# Patient Record
Sex: Male | Born: 1965 | Race: Black or African American | Hispanic: No | Marital: Single | Smoking: Former smoker
Health system: Southern US, Community
[De-identification: ages and names within clinical notes are randomized; demographics above are authoritative.]

## PROBLEM LIST (undated history)

## (undated) DIAGNOSIS — E785 Hyperlipidemia, unspecified: Secondary | ICD-10-CM

## (undated) DIAGNOSIS — N471 Phimosis: Secondary | ICD-10-CM

## (undated) DIAGNOSIS — Z87448 Personal history of other diseases of urinary system: Secondary | ICD-10-CM

## (undated) DIAGNOSIS — F329 Major depressive disorder, single episode, unspecified: Secondary | ICD-10-CM

## (undated) DIAGNOSIS — F1411 Cocaine abuse, in remission: Secondary | ICD-10-CM

## (undated) DIAGNOSIS — F32A Depression, unspecified: Secondary | ICD-10-CM

## (undated) DIAGNOSIS — I10 Essential (primary) hypertension: Secondary | ICD-10-CM

## (undated) DIAGNOSIS — Z8709 Personal history of other diseases of the respiratory system: Secondary | ICD-10-CM

## (undated) DIAGNOSIS — F39 Unspecified mood [affective] disorder: Secondary | ICD-10-CM

## (undated) HISTORY — PX: DENTAL SURGERY: SHX609

---

## 1998-11-16 ENCOUNTER — Emergency Department (HOSPITAL_COMMUNITY): Admission: EM | Admit: 1998-11-16 | Discharge: 1998-11-16 | Payer: Self-pay | Admitting: Emergency Medicine

## 1998-11-16 ENCOUNTER — Encounter: Payer: Self-pay | Admitting: Emergency Medicine

## 1999-04-15 ENCOUNTER — Emergency Department (HOSPITAL_COMMUNITY): Admission: EM | Admit: 1999-04-15 | Discharge: 1999-04-15 | Payer: Self-pay | Admitting: Emergency Medicine

## 2000-01-09 ENCOUNTER — Encounter: Payer: Self-pay | Admitting: Emergency Medicine

## 2000-01-09 ENCOUNTER — Inpatient Hospital Stay (HOSPITAL_COMMUNITY): Admission: EM | Admit: 2000-01-09 | Discharge: 2000-01-13 | Payer: Self-pay | Admitting: Emergency Medicine

## 2000-01-10 ENCOUNTER — Encounter: Payer: Self-pay | Admitting: Internal Medicine

## 2000-01-11 ENCOUNTER — Encounter: Payer: Self-pay | Admitting: Internal Medicine

## 2002-01-27 ENCOUNTER — Emergency Department (HOSPITAL_COMMUNITY): Admission: EM | Admit: 2002-01-27 | Discharge: 2002-01-27 | Payer: Self-pay | Admitting: Emergency Medicine

## 2006-10-03 ENCOUNTER — Encounter: Admission: RE | Admit: 2006-10-03 | Discharge: 2006-10-03 | Payer: Self-pay | Admitting: Occupational Medicine

## 2010-07-05 ENCOUNTER — Ambulatory Visit: Payer: Self-pay | Admitting: Gastroenterology

## 2010-07-21 ENCOUNTER — Encounter: Payer: Self-pay | Admitting: Gastroenterology

## 2010-07-21 ENCOUNTER — Ambulatory Visit
Admission: RE | Admit: 2010-07-21 | Discharge: 2010-07-21 | Payer: Self-pay | Source: Home / Self Care | Attending: Gastroenterology | Admitting: Gastroenterology

## 2010-07-25 ENCOUNTER — Ambulatory Visit
Admission: RE | Admit: 2010-07-25 | Discharge: 2010-07-25 | Payer: Self-pay | Source: Home / Self Care | Attending: Gastroenterology | Admitting: Gastroenterology

## 2010-07-29 ENCOUNTER — Emergency Department (HOSPITAL_COMMUNITY)
Admission: EM | Admit: 2010-07-29 | Discharge: 2010-07-29 | Payer: Self-pay | Source: Home / Self Care | Admitting: Emergency Medicine

## 2010-08-17 NOTE — Procedures (Signed)
Summary: Flexible Sigmoidoscopy  Patient: Joseph Mckee Note: All result statuses are Final unless otherwise noted.  Tests: (1) Flexible Sigmoidoscopy (FLX)  FLX Flexible Sigmoidoscopy                             DONE     [facility]     [address]     [place], [place]  [address]           FLEXIBLE SIGMOIDOSCOPY PROCEDURE REPORT           PATIENT:  Joseph, Mckee  MR#:  [id number]     BIRTHDATE:  [birth year], 44 yrs. old  GENDER:  male           ENDOSCOPIST:  Barbette Hair. Arlyce Dice, MD     Referred by:           PROCEDURE DATE:  07/21/2010     PROCEDURE:  Flexible Sigmoidoscopy, diagnostic     ASA CLASS:  Class I     INDICATIONS:  research IBS/Diarrhea [place] Study           MEDICATIONS:   Fentanyl 50 mcg IV, Versed 5 mg IV           DESCRIPTION OF PROCEDURE:   After the risks benefits and     alternatives of the procedure were thoroughly explained, informed     consent was obtained.  Digital rectal exam was performed and     revealed no abnormalities.   The [id number] endoscope     was introduced through the anus and advanced to the descending     colon (60cm), without limitations.  The quality of the prep was     excellent .  The instrument was then slowly withdrawn as the     mucosa was fully examined.     <<PROCEDUREIMAGES>>           The area of colon examined was normal in appearance (see image1,     image5, and image6).   Retroflexed views in the rectum revealed no     abnormalities.    The scope was then withdrawn from the patient     and the procedure terminated.           COMPLICATIONS:  None           ENDOSCOPIC IMPRESSION:     1) Normal colon     RECOMMENDATIONS:Per study protocol           REPEAT EXAM:  No           ______________________________     Barbette Hair. Arlyce Dice, MD           CC:           n.     eSIGNED:   Barbette Hair. Kaplan at 07/21/2010 03:48 PM           [place], [place], [id number]  Note: An exclamation mark (!) indicates  a result that was not dispersed into the flowsheet. Document Creation Date: 07/21/2010 3:48 PM _______________________________________________________________________  (1) Order result status: Final Collection or observation date-time: 07/21/2010 15:45 Requested date-time:  Receipt date-time:  Reported date-time:  Referring Physician:   Ordering Physician: Melvia Heaps [phone]) Specimen Source:  Source: Launa Grill Order Number: [id number] Lab site:

## 2010-08-22 ENCOUNTER — Ambulatory Visit: Payer: Self-pay

## 2010-08-23 ENCOUNTER — Ambulatory Visit (INDEPENDENT_AMBULATORY_CARE_PROVIDER_SITE_OTHER): Payer: Self-pay

## 2010-08-23 DIAGNOSIS — R197 Diarrhea, unspecified: Secondary | ICD-10-CM

## 2010-09-15 ENCOUNTER — Ambulatory Visit (INDEPENDENT_AMBULATORY_CARE_PROVIDER_SITE_OTHER): Payer: Self-pay

## 2010-09-15 DIAGNOSIS — K589 Irritable bowel syndrome without diarrhea: Secondary | ICD-10-CM

## 2010-09-15 DIAGNOSIS — R197 Diarrhea, unspecified: Secondary | ICD-10-CM

## 2010-10-16 ENCOUNTER — Ambulatory Visit: Payer: Self-pay

## 2010-10-18 ENCOUNTER — Ambulatory Visit (INDEPENDENT_AMBULATORY_CARE_PROVIDER_SITE_OTHER): Payer: Self-pay

## 2010-10-18 DIAGNOSIS — K589 Irritable bowel syndrome without diarrhea: Secondary | ICD-10-CM

## 2010-10-19 ENCOUNTER — Ambulatory Visit: Payer: Self-pay
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
## 2011-01-19 ENCOUNTER — Ambulatory Visit: Payer: Self-pay

## 2011-01-26 ENCOUNTER — Ambulatory Visit (INDEPENDENT_AMBULATORY_CARE_PROVIDER_SITE_OTHER): Payer: Self-pay

## 2011-01-26 DIAGNOSIS — K5900 Constipation, unspecified: Secondary | ICD-10-CM

## 2011-02-01 ENCOUNTER — Ambulatory Visit: Payer: Self-pay

## 2014-01-14 ENCOUNTER — Other Ambulatory Visit: Payer: Self-pay | Admitting: Urology

## 2014-01-19 ENCOUNTER — Encounter (HOSPITAL_BASED_OUTPATIENT_CLINIC_OR_DEPARTMENT_OTHER): Payer: Self-pay | Admitting: *Deleted

## 2014-01-20 ENCOUNTER — Encounter (HOSPITAL_BASED_OUTPATIENT_CLINIC_OR_DEPARTMENT_OTHER): Payer: Self-pay | Admitting: *Deleted

## 2014-01-20 NOTE — Progress Notes (Signed)
NPO AFTER MN. ARRIVE AT 1030. NEEDS ISTAT AND EKG. WILL TAKE LIPITOR AND DEPAKOTE AM DOS W/ SIPS OF WATER.

## 2014-01-22 ENCOUNTER — Encounter (HOSPITAL_BASED_OUTPATIENT_CLINIC_OR_DEPARTMENT_OTHER)
Admission: RE | Disposition: A | Discharge status: Home / Self Care | Payer: Self-pay | Source: Ambulatory Visit | Attending: Urology

## 2014-01-22 ENCOUNTER — Encounter (HOSPITAL_BASED_OUTPATIENT_CLINIC_OR_DEPARTMENT_OTHER): Payer: Self-pay

## 2014-01-22 ENCOUNTER — Encounter (HOSPITAL_BASED_OUTPATIENT_CLINIC_OR_DEPARTMENT_OTHER): Payer: BC Managed Care – PPO | Admitting: Anesthesiology

## 2014-01-22 ENCOUNTER — Ambulatory Visit (HOSPITAL_BASED_OUTPATIENT_CLINIC_OR_DEPARTMENT_OTHER): Payer: BC Managed Care – PPO | Admitting: Anesthesiology

## 2014-01-22 ENCOUNTER — Ambulatory Visit (HOSPITAL_BASED_OUTPATIENT_CLINIC_OR_DEPARTMENT_OTHER)
Admission: RE | Admit: 2014-01-22 | Discharge: 2014-01-22 | Disposition: A | Discharge status: Home / Self Care | Payer: BC Managed Care – PPO | Source: Ambulatory Visit | Attending: Urology | Admitting: Urology

## 2014-01-22 DIAGNOSIS — Z79899 Other long term (current) drug therapy: Secondary | ICD-10-CM | POA: Insufficient documentation

## 2014-01-22 DIAGNOSIS — F17200 Nicotine dependence, unspecified, uncomplicated: Secondary | ICD-10-CM | POA: Insufficient documentation

## 2014-01-22 DIAGNOSIS — Z6836 Body mass index (BMI) 36.0-36.9, adult: Secondary | ICD-10-CM | POA: Insufficient documentation

## 2014-01-22 DIAGNOSIS — N478 Other disorders of prepuce: Secondary | ICD-10-CM | POA: Insufficient documentation

## 2014-01-22 DIAGNOSIS — F3289 Other specified depressive episodes: Secondary | ICD-10-CM | POA: Insufficient documentation

## 2014-01-22 DIAGNOSIS — F329 Major depressive disorder, single episode, unspecified: Secondary | ICD-10-CM | POA: Insufficient documentation

## 2014-01-22 DIAGNOSIS — I10 Essential (primary) hypertension: Secondary | ICD-10-CM | POA: Insufficient documentation

## 2014-01-22 DIAGNOSIS — N476 Balanoposthitis: Secondary | ICD-10-CM | POA: Insufficient documentation

## 2014-01-22 DIAGNOSIS — E669 Obesity, unspecified: Secondary | ICD-10-CM | POA: Insufficient documentation

## 2014-01-22 DIAGNOSIS — N471 Phimosis: Principal | ICD-10-CM | POA: Insufficient documentation

## 2014-01-22 HISTORY — DX: Essential (primary) hypertension: I10

## 2014-01-22 HISTORY — PX: CIRCUMCISION: SHX1350

## 2014-01-22 HISTORY — DX: Depression, unspecified: F32.A

## 2014-01-22 HISTORY — DX: Hyperlipidemia, unspecified: E78.5

## 2014-01-22 HISTORY — DX: Cocaine abuse, in remission: F14.11

## 2014-01-22 HISTORY — DX: Personal history of other diseases of the respiratory system: Z87.09

## 2014-01-22 HISTORY — DX: Personal history of other diseases of urinary system: Z87.448

## 2014-01-22 HISTORY — DX: Major depressive disorder, single episode, unspecified: F32.9

## 2014-01-22 HISTORY — DX: Unspecified mood (affective) disorder: F39

## 2014-01-22 HISTORY — DX: Phimosis: N47.1

## 2014-01-22 LAB — POCT I-STAT 4, (NA,K, GLUC, HGB,HCT)
Glucose, Bld: 88 mg/dL (ref 70–99)
HEMATOCRIT: 40 % (ref 39.0–52.0)
Hemoglobin: 13.6 g/dL (ref 13.0–17.0)
Potassium: 3.8 mEq/L (ref 3.7–5.3)
Sodium: 140 mEq/L (ref 137–147)

## 2014-01-22 SURGERY — CIRCUMCISION, ADULT
Anesthesia: General | Site: Penis

## 2014-01-22 MED ORDER — DEXAMETHASONE SODIUM PHOSPHATE 4 MG/ML IJ SOLN
INTRAMUSCULAR | Status: DC | PRN
  Administered 2014-01-22: 10 mg via INTRAVENOUS

## 2014-01-22 MED ORDER — LACTATED RINGERS IV SOLN
INTRAVENOUS | Status: DC
  Administered 2014-01-22 (×2): via INTRAVENOUS
  Filled 2014-01-22: qty 1000

## 2014-01-22 MED ORDER — HYDROCODONE-ACETAMINOPHEN 5-325 MG PO TABS
1.0000 | ORAL_TABLET | Freq: Four times a day (QID) | ORAL | Status: DC | PRN
  Administered 2014-01-22: 1 via ORAL
  Filled 2014-01-22: qty 1

## 2014-01-22 MED ORDER — BACITRACIN-NEOMYCIN-POLYMYXIN 400-5-5000 EX OINT: TOPICAL_OINTMENT | CUTANEOUS | Status: DC | PRN

## 2014-01-22 MED ORDER — MIDAZOLAM HCL 5 MG/5ML IJ SOLN
INTRAMUSCULAR | Status: DC | PRN
  Administered 2014-01-22: 2 mg via INTRAVENOUS

## 2014-01-22 MED ORDER — FENTANYL CITRATE 0.05 MG/ML IJ SOLN
INTRAMUSCULAR | Status: DC | PRN
  Administered 2014-01-22 (×2): 50 ug via INTRAVENOUS

## 2014-01-22 MED ORDER — MINERAL OIL LIGHT 100 % EX OIL
TOPICAL_OIL | CUTANEOUS | Status: DC | PRN
  Administered 2014-01-22: 1 via TOPICAL

## 2014-01-22 MED ORDER — FENTANYL CITRATE 0.05 MG/ML IJ SOLN
INTRAMUSCULAR | Status: AC
  Filled 2014-01-22: qty 4

## 2014-01-22 MED ORDER — BUPIVACAINE HCL (PF) 0.25 % IJ SOLN
INTRAMUSCULAR | Status: DC | PRN
  Administered 2014-01-22: 10 mL

## 2014-01-22 MED ORDER — HYDROCODONE-ACETAMINOPHEN 5-325 MG PO TABS: 1.0000 | ORAL_TABLET | Freq: Four times a day (QID) | ORAL | Status: DC | PRN

## 2014-01-22 MED ORDER — PROPOFOL 10 MG/ML IV BOLUS
INTRAVENOUS | Status: DC | PRN
  Administered 2014-01-22: 300 mg via INTRAVENOUS

## 2014-01-22 MED ORDER — HYDROCODONE-ACETAMINOPHEN 5-325 MG PO TABS
ORAL_TABLET | ORAL | Status: AC
  Filled 2014-01-22: qty 1

## 2014-01-22 MED ORDER — ONDANSETRON HCL 4 MG/2ML IJ SOLN
INTRAMUSCULAR | Status: DC | PRN
  Administered 2014-01-22: 4 mg via INTRAVENOUS

## 2014-01-22 MED ORDER — HYDROMORPHONE HCL PF 1 MG/ML IJ SOLN
0.2500 mg | INTRAMUSCULAR | Status: DC | PRN
  Filled 2014-01-22: qty 1

## 2014-01-22 MED ORDER — MIDAZOLAM HCL 2 MG/2ML IJ SOLN
INTRAMUSCULAR | Status: AC
  Filled 2014-01-22: qty 2

## 2014-01-22 MED ORDER — OXYCODONE HCL 5 MG PO TABS
5.0000 mg | ORAL_TABLET | Freq: Once | ORAL | Status: DC | PRN
  Filled 2014-01-22: qty 1

## 2014-01-22 MED ORDER — MEPERIDINE HCL 25 MG/ML IJ SOLN
6.2500 mg | INTRAMUSCULAR | Status: DC | PRN
  Filled 2014-01-22: qty 1

## 2014-01-22 MED ORDER — LIDOCAINE HCL (CARDIAC) 20 MG/ML IV SOLN
INTRAVENOUS | Status: DC | PRN
  Administered 2014-01-22: 100 mg via INTRAVENOUS

## 2014-01-22 MED ORDER — OXYCODONE HCL 5 MG/5ML PO SOLN
5.0000 mg | Freq: Once | ORAL | Status: DC | PRN
  Filled 2014-01-22: qty 5

## 2014-01-22 MED ORDER — PROMETHAZINE HCL 25 MG/ML IJ SOLN
6.2500 mg | INTRAMUSCULAR | Status: DC | PRN
Start: 2014-01-22 — End: 2014-01-22
  Filled 2014-01-22: qty 1

## 2014-01-22 SURGICAL SUPPLY — 27 items
BANDAGE CO FLEX L/F 1IN X 5YD (GAUZE/BANDAGES/DRESSINGS) IMPLANT
BLADE SURG 15 STRL LF DISP TIS (BLADE) ×1 IMPLANT
BLADE SURG 15 STRL SS (BLADE) ×2
BNDG COHESIVE 1X5 TAN STRL LF (GAUZE/BANDAGES/DRESSINGS) ×1 IMPLANT
BNDG CONFORM 2 STRL LF (GAUZE/BANDAGES/DRESSINGS) IMPLANT
CLOTH BEACON ORANGE TIMEOUT ST (SAFETY) ×2 IMPLANT
COVER MAYO STAND STRL (DRAPES) ×2 IMPLANT
COVER TABLE BACK 60X90 (DRAPES) ×2 IMPLANT
DRAPE PED LAPAROTOMY (DRAPES) ×2 IMPLANT
ELECT REM PT RETURN 9FT ADLT (ELECTROSURGICAL) ×2
ELECTRODE REM PT RTRN 9FT ADLT (ELECTROSURGICAL) ×1 IMPLANT
GAUZE VASELINE 1X8 (GAUZE/BANDAGES/DRESSINGS) ×1 IMPLANT
GAUZE XEROFORM 1X8 LF (GAUZE/BANDAGES/DRESSINGS) ×1 IMPLANT
GLOVE BIO SURGEON STRL SZ7 (GLOVE) ×2 IMPLANT
GOWN PREVENTION PLUS LG XLONG (DISPOSABLE) ×1 IMPLANT
GOWN STRL REUS W/TWL LRG LVL3 (GOWN DISPOSABLE) ×2 IMPLANT
NDL HYPO 25X1 1.5 SAFETY (NEEDLE) ×1 IMPLANT
NEEDLE HYPO 25X1 1.5 SAFETY (NEEDLE) ×2 IMPLANT
PACK BASIN DAY SURGERY FS (CUSTOM PROCEDURE TRAY) ×2 IMPLANT
PENCIL BUTTON HOLSTER BLD 10FT (ELECTRODE) ×2 IMPLANT
SUT CHROMIC 4 0 P 3 18 (SUTURE) ×2 IMPLANT
SUT CHROMIC 5 0 RB 1 27 (SUTURE) IMPLANT
SYR CONTROL 10ML LL (SYRINGE) ×2 IMPLANT
TOWEL OR 17X24 6PK STRL BLUE (TOWEL DISPOSABLE) ×4 IMPLANT
TRAY DSU PREP LF (CUSTOM PROCEDURE TRAY) ×2 IMPLANT
UNDERPAD 30X30 INCONTINENT (UNDERPADS AND DIAPERS) ×1 IMPLANT
WATER STERILE IRR 500ML POUR (IV SOLUTION) ×2 IMPLANT

## 2014-01-22 NOTE — Anesthesia Preprocedure Evaluation (Signed)
Anesthesia Evaluation  Patient identified by MRN, date of birth, ID band Patient awake    Reviewed: Allergy & Precautions, H&P , NPO status , Patient's Chart, lab work & pertinent test results  Airway Mallampati: II TM Distance: >3 FB Neck ROM: Full    Dental   Pulmonary Current Smoker,  breath sounds clear to auscultation        Cardiovascular hypertension, Pt. on medications Rhythm:Regular Rate:Normal     Neuro/Psych PSYCHIATRIC DISORDERS Depression negative neurological ROS     GI/Hepatic negative GI ROS, Neg liver ROS,   Endo/Other  negative endocrine ROS  Renal/GU negative Renal ROS     Musculoskeletal negative musculoskeletal ROS (+)   Abdominal (+) + obese,   Peds  Hematology negative hematology ROS (+)   Anesthesia Other Findings   Reproductive/Obstetrics                           Anesthesia Physical Anesthesia Plan  ASA: II  Anesthesia Plan: General   Post-op Pain Management:    Induction: Intravenous  Airway Management Planned: LMA  Additional Equipment:   Intra-op Plan:   Post-operative Plan: Extubation in OR  Informed Consent: I have reviewed the patients History and Physical, chart, labs and discussed the procedure including the risks, benefits and alternatives for the proposed anesthesia with the patient or authorized representative who has indicated his/her understanding and acceptance.   Dental advisory given  Plan Discussed with: CRNA  Anesthesia Plan Comments:         Anesthesia Quick Evaluation

## 2014-01-22 NOTE — H&P (Signed)
History of Present Illness Joseph Mckee presents today with several months of increasing difficulty retracting the foreskin. Erections are also painful. It is especially difficult to cleanse himself. There is accumulation of smegma under the foreskin. There is an odor to the genital area. He would like to be circumcised.   Past Medical History Problems  1. History of No acute medical problems  Surgical History Problems  1. History of No Surgical Problems  Current Meds 1. Benicar 20 MG Oral Tablet;  Therapy: (Recorded:02Jul2015) to Recorded 2. Lipitor TABS;  Therapy: (Recorded:02Jul2015) to Recorded  Allergies Medication  1. No Known Drug Allergies  Family History Problems  1. No pertinent family history : Mother, Father  Social History Problems    Denied: History of Alcohol use   Caffeine use (V49.89)   Current smoker (305.1)   Occupation   health care   Single  Review of Systems Genitourinary, constitutional, skin, eye, otolaryngeal, hematologic/lymphatic, cardiovascular, pulmonary, endocrine, musculoskeletal, gastrointestinal, neurological and psychiatric system(s) were reviewed and pertinent findings if present are noted.  Genitourinary: urinary frequency, erectile dysfunction and penile pain.    Vitals Vital Signs [Data Includes: Last 1 Day]  Recorded: 02Jul2015 09:18AM  Height: 5 ft 10 in Weight: 253 lb  BMI Calculated: 36.3 BSA Calculated: 2.31 Blood Pressure: 108 / 72 Temperature: 97.2 F  Physical Exam Constitutional: Well nourished and well developed . No acute distress.  ENT:. The ears and nose are normal in appearance.  Neck: The appearance of the neck is normal and no neck mass is present.  Pulmonary: No respiratory distress and normal respiratory rhythm and effort.  Cardiovascular: Heart rate and rhythm are normal . No peripheral edema.  Abdomen: The abdomen is soft and nontender. No masses are palpated. No CVA tenderness. No hernias are  palpable. No hepatosplenomegaly noted.  Rectal: Rectal exam demonstrates normal sphincter tone, no tenderness and no masses. The prostate has no nodularity and is not tender. The left seminal vesicle is nonpalpable. The right seminal vesicle is nonpalpable. The perineum is normal on inspection.  Genitourinary: Examination of the penis demonstrates no discharge, no masses, no lesions and a normal meatus. The penis is uncircumcised. The scrotum is without lesions. Examination of the right scrotum demonstrates no hydrocele. Examination of the left scrotum demostrates no hydrocele. The right epididymis is palpably normal and non-tender. The left epididymis is palpably normal and non-tender. The right spermatic cord is palpably normal. The left spermatic cord is palpably normal. The right testis is palpably normal, non-tender and without masses. The left testis is normal, non-tender and without masses.  Lymphatics: The femoral and inguinal nodes are not enlarged or tender.  Skin: Normal skin turgor, no visible rash and no visible skin lesions.  Neuro/Psych:. Mood and affect are appropriate.    Results/Data Urine [Data Includes: Last 1 Day]   02Jul2015  COLOR YELLOW   APPEARANCE CLEAR   SPECIFIC GRAVITY 1.025   pH 5.5   GLUCOSE NEG mg/dL  BILIRUBIN NEG   KETONE NEG mg/dL  BLOOD NEG   PROTEIN NEG mg/dL  UROBILINOGEN 0.2 mg/dL  NITRITE NEG   LEUKOCYTE ESTERASE NEG    Assessment Assessed  1. Phimosis (605)  Plan Health Maintenance  1. UA With REFLEX; [Do Not Release]; Status:Resulted - Requires Verification;   Done:  02Jul2015 08:55AM Phimosis  2. Follow-up Schedule Surgery Office  Follow-up  Status: Complete  Done: 02Jul2015  Patient could benefit from circumcision. The procedure, risks, benefits were explained to the patient. The risks include  but are not limited to hemorrhage, hematoma, infection, skin loss requiring skin graft. He understands and wishes to proceed.

## 2014-01-22 NOTE — Discharge Instructions (Signed)
Circumcision-Home Care Instructions ° °The following instructions have been prepared to help you care for yourself upon your return home today.  ° °Wound Care & Hygiene:  ° °You may apply ice to the penis.  This may help to decrease swelling.  Remove the dressing tomorrow.  If the dressing falls off before then, leave it off.  You may shower or bathe in 48 hours  Gently wash the penis with soap and water.  The stitches do not need to be removed. ° °Activity: ° °Do not drive or operate any equipment today.  The effects of anesthesia are still present, drowsiness may result.  Rest today, not necessarily flat bed rest, just take it easy.  You may resume your normal activity in one to two days or as indicated by your physician. ° °Sexual Activity:  Erection and sexual relations should be avoided for *2 weeks. ° °Return to Work: ° °One to two days or as indicated by your physician ***. ° °Diet:  Drink liquids or eat a very light diet this evening.  You may resume a regular diet tomorrow. ° °General Expectations of your surgery: ° °· You may have a small amount of bleeding °· The penis will be swollen and bruised for approximately one week °· You may wake during the night with an erection, usually this is caused by having a full bladder so you should try to urinate (pass your water) to relieve the erection or apply ice to the penis ° °Unexpected Observations - Call your doctor if these occur! °· Persistent or heavy bleeding °· Temperature of 101 degrees or more °· Severe pain not relieved by medication ° °Return to Office *** .  Call to schedule your appointment ***. ° °Additional Instructions:  *** ° °Patient's Signature:__________________________________________________ ° °Physician's Signature:___________________ Nurse's Signature_______________ ° °[facility] [address], [place], [place] [address] °Tel: [phone] ° ° ° °Post Anesthesia Home Care Instructions ° °Activity: °Get plenty of rest for  the remainder of the day. A responsible adult should stay with you for 24 hours following the procedure.  °For the next 24 hours, DO NOT: °-Drive a car °-Operate machinery °-Drink alcoholic beverages °-Take any medication unless instructed by your physician °-Make any legal decisions or sign important papers. ° °Meals: °Start with liquid foods such as gelatin or soup. Progress to regular foods as tolerated. Avoid greasy, spicy, heavy foods. If nausea and/or vomiting occur, drink only clear liquids until the nausea and/or vomiting subsides. Call your physician if vomiting continues. ° °Special Instructions/Symptoms: °Your throat may feel dry or sore from the anesthesia or the breathing tube placed in your throat during surgery. If this causes discomfort, gargle with warm salt water. The discomfort should disappear within 24 hours. ° °

## 2014-01-22 NOTE — Transfer of Care (Signed)
Immediate Anesthesia Transfer of Care Note  Patient: Joseph Mckee  Procedure(s) Performed: Procedure(s): CIRCUMCISION ADULT (N/A)  Patient Location: PACU  Anesthesia Type:General  Level of Consciousness: awake and oriented  Airway & Oxygen Therapy: Patient Spontanous Breathing and Patient connected to nasal cannula oxygen  Post-op Assessment: Report given to PACU RN  Post vital signs: Reviewed and stable  Complications: No apparent anesthesia complications

## 2014-01-22 NOTE — Progress Notes (Signed)
Patient discharged at 1537 and instructions given at 1520

## 2014-01-22 NOTE — Anesthesia Postprocedure Evaluation (Signed)
  Anesthesia Post-op Note  Patient: Joseph Mckee  Procedure(s) Performed: Procedure(s) (LRB): CIRCUMCISION ADULT (N/A)  Patient Location: PACU  Anesthesia Type: General  Level of Consciousness: awake and alert   Airway and Oxygen Therapy: Patient Spontanous Breathing  Post-op Pain: mild  Post-op Assessment: Post-op Vital signs reviewed, Patient's Cardiovascular Status Stable, Respiratory Function Stable, Patent Airway and No signs of Nausea or vomiting  Last Vitals:  Filed Vitals:   01/22/14 1404  BP: 143/87  Pulse: 81  Temp: 36.3 C  Resp: 14    Post-op Vital Signs: stable   Complications: No apparent anesthesia complications

## 2014-01-22 NOTE — Op Note (Signed)
Joseph Mckee is a 48 y.o.   01/22/2014  General  Pre-op diagnosis: Phimosis, balanitis.  Postop diagnosis: Same  Procedure done: Circumcision  Surgeon: Wendie SimmerMarc H. Shawne Eskelson  Anesthesia: General  Indication: Patient is a 48 years old male who has been having difficulty retracting his foreskin. Erections are also painful. It is also difficult for him to cleanse himself. There is also accumulation of smegma under the skin. He is scheduled for circumcision.  Procedure: Patient was identified by his wrist band and proper timeout was taken.  Under general anesthesia he was prepped and draped and placed in the supine position. 2 circumferential incisions were made on the foreskin and the foreskin in between those 2 incisions was excised. Hemostasis was secured with electrocautery. Skin approximation was then done with #4-0 chromic. Sterile dressing was applied to the wound.  EBL: Minimal   Needles, sponges count: Correct  The patient tolerated the procedure well and left the or in satisfactory condition to postanesthesia care unit

## 2014-01-25 ENCOUNTER — Encounter (HOSPITAL_BASED_OUTPATIENT_CLINIC_OR_DEPARTMENT_OTHER): Payer: Self-pay | Admitting: Urology

## 2015-02-09 ENCOUNTER — Ambulatory Visit
Admission: RE | Admit: 2015-02-09 | Discharge: 2015-02-09 | Disposition: A | Discharge status: Home / Self Care | Payer: No Typology Code available for payment source | Source: Ambulatory Visit | Attending: Family Medicine | Admitting: Family Medicine

## 2015-02-09 ENCOUNTER — Other Ambulatory Visit: Payer: Self-pay | Admitting: Family Medicine

## 2015-02-09 DIAGNOSIS — Z0000 Encounter for general adult medical examination without abnormal findings: Secondary | ICD-10-CM

## 2018-08-10 ENCOUNTER — Encounter (HOSPITAL_COMMUNITY): Payer: Self-pay

## 2018-08-10 ENCOUNTER — Emergency Department (HOSPITAL_COMMUNITY): Payer: BLUE CROSS/BLUE SHIELD

## 2018-08-10 ENCOUNTER — Other Ambulatory Visit: Payer: Self-pay

## 2018-08-10 ENCOUNTER — Emergency Department (HOSPITAL_COMMUNITY)
Admission: EM | Admit: 2018-08-10 | Discharge: 2018-08-10 | Disposition: A | Discharge status: Home / Self Care | Payer: BLUE CROSS/BLUE SHIELD | Attending: Emergency Medicine | Admitting: Emergency Medicine

## 2018-08-10 DIAGNOSIS — M545 Low back pain: Secondary | ICD-10-CM | POA: Diagnosis not present

## 2018-08-10 DIAGNOSIS — M25562 Pain in left knee: Secondary | ICD-10-CM | POA: Insufficient documentation

## 2018-08-10 DIAGNOSIS — M542 Cervicalgia: Secondary | ICD-10-CM | POA: Diagnosis not present

## 2018-08-10 DIAGNOSIS — F17210 Nicotine dependence, cigarettes, uncomplicated: Secondary | ICD-10-CM | POA: Insufficient documentation

## 2018-08-10 DIAGNOSIS — F329 Major depressive disorder, single episode, unspecified: Secondary | ICD-10-CM | POA: Insufficient documentation

## 2018-08-10 DIAGNOSIS — Z79899 Other long term (current) drug therapy: Secondary | ICD-10-CM | POA: Insufficient documentation

## 2018-08-10 DIAGNOSIS — I10 Essential (primary) hypertension: Secondary | ICD-10-CM | POA: Insufficient documentation

## 2018-08-10 DIAGNOSIS — M546 Pain in thoracic spine: Secondary | ICD-10-CM | POA: Diagnosis not present

## 2018-08-10 DIAGNOSIS — M25572 Pain in left ankle and joints of left foot: Secondary | ICD-10-CM | POA: Diagnosis not present

## 2018-08-10 DIAGNOSIS — M25552 Pain in left hip: Secondary | ICD-10-CM | POA: Diagnosis not present

## 2018-08-10 DIAGNOSIS — M7918 Myalgia, other site: Secondary | ICD-10-CM

## 2018-08-10 MED ORDER — ACETAMINOPHEN 500 MG PO TABS
1000.0000 mg | ORAL_TABLET | Freq: Once | ORAL | Status: DC
  Filled 2018-08-10: qty 2

## 2018-08-10 MED ORDER — METHOCARBAMOL 500 MG PO TABS: 500.0000 mg | ORAL_TABLET | Freq: Two times a day (BID) | ORAL | 0 refills | Status: DC
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
## 2018-09-02 ENCOUNTER — Telehealth: Payer: Self-pay
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
## 2018-09-02 NOTE — Telephone Encounter (Signed)
Patient is calling back. Please advise [phone]

## 2018-09-03 NOTE — Telephone Encounter (Signed)
Left message for patient to call back  
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
## 2018-09-09 ENCOUNTER — Encounter: Payer: Self-pay | Admitting: Family Medicine

## 2018-09-09 ENCOUNTER — Ambulatory Visit (INDEPENDENT_AMBULATORY_CARE_PROVIDER_SITE_OTHER): Payer: BLUE CROSS/BLUE SHIELD | Admitting: Family Medicine

## 2018-09-09 DIAGNOSIS — G44309 Post-traumatic headache, unspecified, not intractable: Secondary | ICD-10-CM | POA: Insufficient documentation

## 2018-09-09 DIAGNOSIS — G44329 Chronic post-traumatic headache, not intractable: Secondary | ICD-10-CM

## 2018-09-09 DIAGNOSIS — M5412 Radiculopathy, cervical region: Secondary | ICD-10-CM

## 2018-09-09 MED ORDER — GABAPENTIN 100 MG PO CAPS: 200.0000 mg | ORAL_CAPSULE | Freq: Every day | ORAL | 3 refills | Status: DC

## 2018-09-09 NOTE — Assessment & Plan Note (Signed)
Posttraumatic headache.  Discussed with patient posture, ergonomics, which activities to do which was to avoid.  We discussed that physical therapy would be beneficial.  Gabapentin given that I think will help with some of the nighttime regulation as well.  No significant signs of any type of concussion at the moment though.  I do believe though that patient does have some cervical radiculopathy and will need to monitor.  Follow-up again in 2 to 3 weeks part-time work started but keep patient on the ground.
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
## 2018-09-09 NOTE — Assessment & Plan Note (Signed)
Cervical radiculopathy.  Discussed with patient about gabapentin.  We will monitor.  Physical therapy will be beneficial.  Likely causing some of the discomfort.  I do think some of the referred to decrease in range of motion as well.  Follow-up again 2 weeks
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
## 2018-09-23 ENCOUNTER — Ambulatory Visit: Payer: BLUE CROSS/BLUE SHIELD | Admitting: Family Medicine

## 2018-09-24 NOTE — Progress Notes (Deleted)
Subjective:   @VITALSMCOMMENTS @  Chief Complaint: Joseph Mckee, DOB: 1966-01-12, is a 53 y.o. male who presents for No chief complaint on file.   Injury date : *** Visit #: ***  Previous imagine.   History of Present Illness:    Concussion Self-Reported Symptom Score Symptoms rated on a scale 1-6, in last 24 hours  Headache: ***    Nausea: ***  Vomiting: ***  Balance Difficulty: ***   Dizziness: ***  Fatigue: ***  Trouble Falling Asleep: ***   Sleep More Than Usual: ***  Sleep Less Than Usual: ***  Daytime Drowsiness: ***  Photophobia: ***  Phonophobia: ***  Feeling anxious: ***  Confused: ***  Irritability: ***  Sadness: ***  Nervousness: ***  Feeling More Emotional: ***  Numbness or Tingling: ***  Feeling Slowed Down: ***  Feeling Mentally Foggy: ***  Difficulty Concentrating: ***  Difficulty Remembering: ***  Visual Problems: ***  Neck Pain: ***  Tinnitus: ***   Total Symptom Score: *** Previous Symptom Score: ***  Review of Systems: Pertinent items are noted in HPI.  Review of History: Past Medical History: @PMHP @  Past Surgical History:  has a past surgical history that includes Dental surgery and Circumcision (N/A, 01/22/2014). Family History: family history is not on file. no family history of autoimmune Social History:  reports that he has been smoking cigarettes. He has a 5.00 pack-year smoking history. He has never used smokeless tobacco. He reports that he does not drink alcohol or use drugs. Current Medications: has a current medication list which includes the following prescription(s): atorvastatin, divalproex, gabapentin, hydrocodone-acetaminophen, methocarbamol, and olmesartan-hydrochlorothiazide. Allergies: has No Known Allergies.  Objective:    Physical Examination There were no vitals filed for this visit. General: No apparent distress alert and oriented x3 mood and affect normal, dressed appropriately.  HEENT: Pupils equal,  extraocular movements intact  Respiratory: Patient's speak in full sentences and does not appear short of breath  Cardiovascular: No lower extremity edema, non tender, no erythema  Skin: Warm dry intact with no signs of infection or rash on extremities or on axial skeleton.  Abdomen: Soft nontender  Neuro: Cranial nerves II through XII are intact, neurovascularly intact in all extremities with 2+ DTRs and 2+ pulses.  Lymph: No lymphadenopathy of posterior or anterior cervical chain or axillae bilaterally.  Gait normal with good balance and coordination.  MSK:  Non tender with full range of motion and good stability and symmetric strength and tone of shoulders, elbows, wrist,  knee and ankles bilaterally.  Psychiatric: Oriented X3, intact recent and remote memory, judgement and insight, normal mood and affect  Concussion testing performed today:  I spent *** minutes with patient discussing test and results including review of history and patient chart and  integration of patient data, interpretation of standardized test results and clinical data, clinical decision making, treatment planning and report,and interactive feedback to the patient with all of patients questions answered.    Neurocognitive testing (ImPACT):  Baseline:*** Post #1: ***   Verbal Memory Composite *** (***%) *** (***%)   Visual Memory Composite *** (***%) *** (***%)   Visual Motor Speed Composite *** (***%) *** (***%)   Reaction Time Composite *** (***%) *** (***%)   Cognitive Efficiency Index *** ***     Vestibular Screening:   Pre VOMS  HA Score: *** Pre VOMS  Dizziness Score: ***   Headache  Dizziness  Smooth Pursuits *** ***  H. Saccades *** ***  V. Saccades *** ***  H.  VOR *** ***  V. VOR *** ***  Visual Motor Sensitivity *** ***  Accommodation Right: *** cm Left: *** cm *** ***  Convergence: *** cm Divergence: *** cm *** ***   Balance Screen: ***  Additional testing performed today: { :28529}    Assessment:    No diagnosis found.  Koty Bogusz presents with the following concussion subtypes. [] Cognitive [] Cervical [] Vestibular [] Ocular [] Migraine [] Anxiety/Mood   Plan:   Action/Discussion: Reviewed diagnosis, management options, expected outcomes, and the reasons for scheduled and emergent follow-up. Questions were adequately answered. Patient expressed verbal understanding and agreement with the following plan.      Participation in school/work: Patient is cleared to return to work/school and activities of daily living without restrictions.  Patient is not cleared to return to work/school until further notice.  Patient may return to work/school on ***, with the following restrictions/supports:  Extra Time:  Take mental rest breaks during the day as needed. Check for return of symptoms when participating in any activities that require a significant amount of attention or concentration.  Allow extra time to complete tasks.  Please allow *** weeks to make up missed assignments, test, quizzes.  Visual/Vestibular Accommodations in School:  Allow patient to eat lunch in quiet environment with 1-2 classmates.  Allow patient to leave class 5 minutes before end of period to avoid busy/noisy hallway.  Please provide any supplemental learning materials (power points, lecture notes, handouts, etc) in minimum size 18 font and allow/provide any auditory supplements to learning when possible (books on tape, audio tape lectures, etc) to limit visual stress in the classroom.  Patient is cleared for auditory participation only. Patient is not cleared for homework, quizzes, or tests at this time.   Testing:  May begin taking tests/quizzes on *** with no more than one test/quiz per day.   No significant classroom or standardized testing until ***.  Home/Extracurricular:  Lessen work/homework load to allow adequate cognitive rest. Work *** minutes with intervals of *** minute  breaks (total *** hours).  Limit visual stimulants including: driving, watching television/movies, reading, using cell phone, etc. - to ensure relative visual cognitive rest. NOT cleared for video or phone games. May participate *** minutes with intervals of *** minute breaks (total *** hours).    Active Treatment Strategies:  Fueling your brain is important for recovery. It is essential to stay well hydrated, aiming for half of your body weight in fluid ounces per day (100 lbs = 50 oz). We also recommend eating breakfast to start your day and focus on a well-balanced diet containing lean protein, 'good' fats, and complex carbohydrates. See your nutrition / hydration handout for more details.   Quality sleep is vital in your concussion recovery. We encourage lots of sleep for the first 24-72 hours after injury but following this period it is important to regulate your sleep cycle. We encourage 8 hours of quality sleep per night. See your sleep handout for more details and strategies to quality sleep.  I  Treating your vestibular and visual dysfunction will decrease your recovery time and improve your symptoms. Begin your home vestibular exercise program as directed on your AVS.    Begin taking DHA supplement as directed.  .   Follow-up information:  Follow up appointment at [facility] .   Patient needs to arrive 30 minutes prior to appointment to complete the following tests: { :28378}.    Patient Education:  Reviewed with patient the risks (i.e, a repeat concussion, post-concussion syndrome,  second-impact syndrome) of returning to play prior to complete resolution, and thoroughly reviewed the signs and symptoms of concussion.Reviewed need for complete resolution of all symptoms, with rest AND exertion, prior to return to play.  Reviewed red flags for urgent medical evaluation: worsening symptoms, nausea/vomiting, intractable headache, musculoskeletal changes, focal  neurological deficits.  [facility]'s Concussion Care Plan, which clearly outlines the plans stated above, was given to patient.  I was personally involved with the physical evaluation of and am in agreement with the assessment and treatment plan for this patient.  Greater than 50% of this encounter was spent in direct consultation with the patient in evaluation, counseling, and coordination of care. Duration of encounter: { :28531} minutes.  After Visit Summary printed out and provided to patient as appropriate.

## 2018-09-25 ENCOUNTER — Ambulatory Visit: Payer: BLUE CROSS/BLUE SHIELD | Admitting: Family Medicine

## 2018-09-25 ENCOUNTER — Telehealth: Payer: Self-pay

## 2018-09-25 NOTE — Telephone Encounter (Signed)
Left message for patient to call back to reschedule.

## 2018-09-25 NOTE — Telephone Encounter (Signed)
Copied from CRM [id number]. Topic: Appointment Scheduling - Scheduling Inquiry for Clinic >> Sep 25, 2018  2:09 PM Crist Infante wrote: Reason for CRM: pt called to cancel his concussion #2 appt for today at 3:15.  Pt states they have him on light duty anyway and his work scheduled is in the afternoon. Pt states he needs earliest appt he can get.
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
## 2021-05-03 ENCOUNTER — Ambulatory Visit: Payer: No Typology Code available for payment source | Attending: Family Medicine | Admitting: Family Medicine

## 2021-05-03 ENCOUNTER — Other Ambulatory Visit: Payer: Self-pay

## 2021-05-03 ENCOUNTER — Encounter: Payer: Self-pay | Admitting: Family Medicine

## 2021-05-03 ENCOUNTER — Telehealth: Payer: Self-pay | Admitting: Family Medicine

## 2021-05-03 DIAGNOSIS — I10 Essential (primary) hypertension: Secondary | ICD-10-CM | POA: Insufficient documentation

## 2021-05-03 DIAGNOSIS — Z794 Long term (current) use of insulin: Secondary | ICD-10-CM

## 2021-05-03 DIAGNOSIS — Z23 Encounter for immunization: Secondary | ICD-10-CM

## 2021-05-03 DIAGNOSIS — F320 Major depressive disorder, single episode, mild: Secondary | ICD-10-CM

## 2021-05-03 DIAGNOSIS — E1169 Type 2 diabetes mellitus with other specified complication: Secondary | ICD-10-CM | POA: Insufficient documentation

## 2021-05-03 DIAGNOSIS — E1142 Type 2 diabetes mellitus with diabetic polyneuropathy: Secondary | ICD-10-CM

## 2021-05-03 DIAGNOSIS — Z1211 Encounter for screening for malignant neoplasm of colon: Secondary | ICD-10-CM

## 2021-05-03 DIAGNOSIS — F32A Depression, unspecified: Secondary | ICD-10-CM | POA: Insufficient documentation

## 2021-05-03 DIAGNOSIS — E1159 Type 2 diabetes mellitus with other circulatory complications: Secondary | ICD-10-CM | POA: Insufficient documentation

## 2021-05-03 DIAGNOSIS — I152 Hypertension secondary to endocrine disorders: Secondary | ICD-10-CM

## 2021-05-03 DIAGNOSIS — Z1159 Encounter for screening for other viral diseases: Secondary | ICD-10-CM

## 2021-05-03 DIAGNOSIS — E785 Hyperlipidemia, unspecified: Secondary | ICD-10-CM | POA: Insufficient documentation

## 2021-05-03 LAB — POCT GLYCOSYLATED HEMOGLOBIN (HGB A1C): HbA1c, POC (controlled diabetic range): 6.5 % (ref 0.0–7.0)

## 2021-05-03 LAB — GLUCOSE, POCT (MANUAL RESULT ENTRY): POC Glucose: 125 mg/dl — AB (ref 70–99)

## 2021-05-03 MED ORDER — OLMESARTAN MEDOXOMIL-HCTZ 20-12.5 MG PO TABS: 1.0000 | ORAL_TABLET | Freq: Every morning | ORAL | 1 refills | Status: DC

## 2021-05-03 MED ORDER — METFORMIN HCL 1000 MG PO TABS: 1000.0000 mg | ORAL_TABLET | Freq: Two times a day (BID) | ORAL | 1 refills | Status: DC

## 2021-05-03 MED ORDER — ATORVASTATIN CALCIUM 20 MG PO TABS: 20.0000 mg | ORAL_TABLET | Freq: Every morning | ORAL | 1 refills | Status: DC

## 2021-05-03 MED ORDER — GABAPENTIN 100 MG PO CAPS: 200.0000 mg | ORAL_CAPSULE | Freq: Every day | ORAL | 1 refills | Status: DC

## 2021-05-03 MED ORDER — ACCU-CHEK GUIDE W/DEVICE KIT: PACK | 0 refills | Status: DC

## 2021-05-03 MED ORDER — ACCU-CHEK SOFTCLIX LANCETS MISC: 12 refills | Status: DC

## 2021-05-03 MED ORDER — ACCU-CHEK GUIDE VI STRP: ORAL_STRIP | 12 refills | Status: DC

## 2021-05-03 NOTE — Progress Notes (Signed)
Cbg 125 

## 2021-05-03 NOTE — Telephone Encounter (Signed)
Patient needs referral to Dr. Jannifer Franklin of [facility].  I placed psychiatry referral in epic.  Thank you

## 2021-05-03 NOTE — Patient Instructions (Signed)

## 2021-05-03 NOTE — Progress Notes (Signed)
Subjective:  Patient ID: Joseph Mckee, male    DOB: Mar 31, 1966  Age: 55 y.o. MRN: [id number]  CC: New Patient (Initial Visit)   HPI [name] is a 55 y.o. year old male with a history of type 2 diabetes mellitus (A1c 6.5), diabetic neuropathy, hypertension, hyperlipidemia, type, mood disorder. Previously followed by [facility] with last visit on 01/18/2021 with PCP. Also followed by behavioral health and last visit was on 01/25/2021. His insurance changed and he presents to get established.  Interval History: His BP is elevated and he states his BP has been normal previously. BP was 125/70 at the last PCP visit.  Endorses compliance with his antihypertensive. For his diabetes he is on metformin with no complaints of hypoglycemia and his neuropathy is controlled on gabapentin. He is compliant with his statin.  He needs a Psych referral due to change in his insurance. Denies acute concerns today.   Past Medical History:  Diagnosis Date   Depression    History of acute renal failure    SECONDARY "CRACK " COCAINE   History of cocaine abuse ([place])    PT STATES ,01-20-2014, HAS BEEN CLEAN SINCE 2008 BUT HAD RELAPSE IN 2012.  [place] .   History of pulmonary edema    SECONDARY TO "CRACK COCAINE"   Hyperlipidemia    Hypertension    Mood disorder ([place])    Phimosis     Past Surgical History:  Procedure Laterality Date   CIRCUMCISION N/A 01/22/2014   Procedure: CIRCUMCISION ADULT;  Surgeon: Arvil Persons, MD;  Location: [facility];  Service: Urology;  Laterality: N/A;   DENTAL SURGERY      History reviewed. No pertinent family history.  No Known Allergies  Outpatient Medications Prior to Visit  Medication Sig Dispense Refill   metFORMIN (GLUCOPHAGE) 1000 MG tablet Take by mouth.     divalproex (DEPAKOTE ER) 500 MG 24 hr tablet Take 500 mg by mouth every morning.     atorvastatin (LIPITOR) 20 MG tablet Take 20 mg  by mouth every morning.     gabapentin (NEURONTIN) 100 MG capsule Take 2 capsules (200 mg total) by mouth at bedtime. (Patient not taking: Reported on 05/03/2021) 60 capsule 3   HYDROcodone-acetaminophen (NORCO) 5-325 MG per tablet Take 1 tablet by mouth every 6 (six) hours as needed for moderate pain. 20 tablet 0   methocarbamol (ROBAXIN) 500 MG tablet Take 1 tablet (500 mg total) by mouth 2 (two) times daily. (Patient not taking: No sig reported) 8 tablet 0   olmesartan-hydrochlorothiazide (BENICAR HCT) 20-12.5 MG per tablet Take 1 tablet by mouth every morning.     No facility-administered medications prior to visit.     ROS Review of Systems  Constitutional:  Negative for activity change and appetite change.  HENT:  Negative for sinus pressure and sore throat.   Eyes:  Negative for visual disturbance.  Respiratory:  Negative for cough, chest tightness and shortness of breath.   Cardiovascular:  Negative for chest pain and leg swelling.  Gastrointestinal:  Negative for abdominal distention, abdominal pain, constipation and diarrhea.  Endocrine: Negative.   Genitourinary:  Negative for dysuria.  Musculoskeletal:  Negative for joint swelling and myalgias.  Skin:  Negative for rash.  Allergic/Immunologic: Negative.   Neurological:  Negative for weakness, light-headedness and numbness.  Psychiatric/Behavioral:  Negative for dysphoric mood and suicidal ideas.    Objective:  BP (!) 151/89   Pulse 77  Resp 16   Ht '5\' 10"'  (1.778 m)   Wt 288 lb 3.2 oz (130.7 kg)   SpO2 99%   BMI 41.35 kg/m   BP/Weight 05/03/2021 09/09/2018 9/67/5916  Systolic BP 384 665 993  Diastolic BP 89 82 97  Wt. (Lbs) 288.2 271 -  BMI 41.35 38.88 -      Physical Exam Constitutional:      Appearance: He is well-developed.  Cardiovascular:     Rate and Rhythm: Normal rate.     Heart sounds: Normal heart sounds. No murmur heard. Pulmonary:     Effort: Pulmonary effort is normal.     Breath sounds:  Normal breath sounds. No wheezing or rales.  Chest:     Chest wall: No tenderness.  Abdominal:     General: Bowel sounds are normal. There is no distension.     Palpations: Abdomen is soft. There is no mass.     Tenderness: There is no abdominal tenderness.  Musculoskeletal:        General: Normal range of motion.     Right lower leg: No edema.     Left lower leg: No edema.  Neurological:     Mental Status: He is alert and oriented to person, place, and time.  Psychiatric:        Mood and Affect: Mood normal.    CMP Latest Ref Rng & Units 01/22/2014  Glucose 70 - 99 mg/dL 88  Sodium 137 - 147 mEq/L 140  Potassium 3.7 - 5.3 mEq/L 3.8    Lipid Panel  No results found for: CHOL, TRIG, HDL, CHOLHDL, VLDL, LDLCALC, LDLDIRECT  CBC    Component Value Date/Time   HGB 13.6 01/22/2014 1049   HCT 40.0 01/22/2014 1049    Lab Results  Component Value Date   HGBA1C 6.5 05/03/2021      Assessment & Plan:  1. Type 2 diabetes mellitus with diabetic polyneuropathy, with long-term current use of insulin (HCC) Controlled with A1c of 6.5 Continue current regimen Counseled on Diabetic diet, my plate method, 570 minutes of moderate intensity exercise/week Blood sugar logs with fasting goals of 80-120 mg/dl, random of less than 180 and in the event of sugars less than 60 mg/dl or greater than 400 mg/dl encouraged to notify the clinic. Advised on the need for annual eye exams, annual foot exams, Pneumonia vaccine. - POCT glucose (manual entry) - CMP14+EGFR - LP+Non-HDL Cholesterol - Microalbumin / creatinine urine ratio - POCT glycosylated hemoglobin (Hb A1C) - gabapentin (NEURONTIN) 100 MG capsule; Take 2 capsules (200 mg total) by mouth at bedtime.  Dispense: 180 capsule; Refill: 1 - metFORMIN (GLUCOPHAGE) 1000 MG tablet; Take 1 tablet (1,000 mg total) by mouth 2 (two) times daily with a meal.  Dispense: 180 tablet; Refill: 1 - Blood Glucose Monitoring Suppl (ACCU-CHEK GUIDE) w/Device  KIT; Check blood sugar 3 times daily.  Dispense: 1 kit; Refill: 0 - glucose blood (ACCU-CHEK GUIDE) test strip; Use as instructed  Dispense: 100 each; Refill: 12 - Accu-Chek Softclix Lancets lancets; Use as instructed  Dispense: 100 each; Refill: 12  2. Hyperlipidemia associated with type 2 diabetes mellitus ([place]) We will check lipid panel today Continue statin Low-cholesterol diet - atorvastatin (LIPITOR) 20 MG tablet; Take 1 tablet (20 mg total) by mouth every morning.  Dispense: 90 tablet; Refill: 1  3. Hypertension associated with diabetes ([place]) Uncontrolled Blood pressure at last office visit with PCP in 01/2021 (from care everywhere)was controlled and ambulatory blood pressure readings have been normal  We will make no regimen changes today and reassess blood pressure at next visit Counseled on blood pressure goal of less than 130/80, low-sodium, DASH diet, medication compliance, 150 minutes of moderate intensity exercise per week. Discussed medication compliance, adverse effects. - olmesartan-hydrochlorothiazide (BENICAR HCT) 20-12.5 MG tablet; Take 1 tablet by mouth every morning.  Dispense: 90 tablet; Refill: 1  4. Need for immunization against influenza - Flu Vaccine QUAD 65moIM (Fluarix, Fluzone & Alfiuria Quad PF)  5. Screening for viral disease - HCV Ab w Reflex to Quant PCR - HIV Antibody (routine testing w rflx)  6. Screening for colon cancer - Cologuard  7. Current mild episode of major depressive disorder without prior episode ([place] Controlled Continue current regimen - Ambulatory referral to Psychiatry   Meds ordered this encounter  Medications   atorvastatin (LIPITOR) 20 MG tablet    Sig: Take 1 tablet (20 mg total) by mouth every morning.    Dispense:  90 tablet    Refill:  1   gabapentin (NEURONTIN) 100 MG capsule    Sig: Take 2 capsules (200 mg total) by mouth at bedtime.    Dispense:  180 capsule    Refill:  1   olmesartan-hydrochlorothiazide (BENICAR  HCT) 20-12.5 MG tablet    Sig: Take 1 tablet by mouth every morning.    Dispense:  90 tablet    Refill:  1   metFORMIN (GLUCOPHAGE) 1000 MG tablet    Sig: Take 1 tablet (1,000 mg total) by mouth 2 (two) times daily with a meal.    Dispense:  180 tablet    Refill:  1   Blood Glucose Monitoring Suppl (ACCU-CHEK GUIDE) w/Device KIT    Sig: Check blood sugar 3 times daily.    Dispense:  1 kit    Refill:  0   glucose blood (ACCU-CHEK GUIDE) test strip    Sig: Use as instructed    Dispense:  100 each    Refill:  12   Accu-Chek Softclix Lancets lancets    Sig: Use as instructed    Dispense:  100 each    Refill:  12     Follow-up: No follow-ups on file.       ECharlott Rakes MD, FAAFP. [facility] [place]  05/03/2021, 3:23 PM

## 2021-05-04 LAB — LP+NON-HDL CHOLESTEROL
Cholesterol, Total: 157 mg/dL (ref 100–199)
HDL: 37 mg/dL — ABNORMAL LOW (ref >39)
LDL Chol Calc (NIH): 94 mg/dL (ref 0–99)
Total Non-HDL-Chol (LDL+VLDL): 120 mg/dL (ref 0–129)
Triglycerides: 145 mg/dL (ref 0–149)
VLDL Cholesterol Cal: 26 mg/dL (ref 5–40)

## 2021-05-04 LAB — CMP14+EGFR
ALT: 35 IU/L (ref 0–44)
AST: 24 IU/L (ref 0–40)
Albumin/Globulin Ratio: 1.6 (ref 1.2–2.2)
Albumin: 4.6 g/dL (ref 3.8–4.9)
Alkaline Phosphatase: 121 IU/L (ref 44–121)
BUN/Creatinine Ratio: 10 (ref 9–20)
BUN: 11 mg/dL (ref 6–24)
Bilirubin Total: 0.4 mg/dL (ref 0.0–1.2)
CO2: 24 mmol/L (ref 20–29)
Calcium: 9.9 mg/dL (ref 8.7–10.2)
Chloride: 103 mmol/L (ref 96–106)
Creatinine, Ser: 1.15 mg/dL (ref 0.76–1.27)
Globulin, Total: 2.8 g/dL (ref 1.5–4.5)
Glucose: 108 mg/dL — ABNORMAL HIGH (ref 70–99)
Potassium: 5 mmol/L (ref 3.5–5.2)
Sodium: 140 mmol/L (ref 134–144)
Total Protein: 7.4 g/dL (ref 6.0–8.5)
eGFR: 76 mL/min/{1.73_m2} (ref >59)

## 2021-05-04 LAB — MICROALBUMIN / CREATININE URINE RATIO
Creatinine, Urine: 160 mg/dL
Microalb/Creat Ratio: 7 mg/g creat (ref 0–29)
Microalbumin, Urine: 10.9 ug/mL

## 2021-05-04 LAB — HCV INTERPRETATION

## 2021-05-04 LAB — HIV ANTIBODY (ROUTINE TESTING W REFLEX): HIV Screen 4th Generation wRfx: NONREACTIVE

## 2021-05-04 LAB — HCV AB W REFLEX TO QUANT PCR: HCV Ab: <0.1 s/co ratio (ref 0.0–0.9)

## 2021-05-16 ENCOUNTER — Telehealth: Payer: Self-pay

## 2021-05-16 NOTE — Telephone Encounter (Signed)
Patient was called and a voicemail was left informing patient to return phone call for lab results.   Normal results letter has been mailed. 

## 2021-05-16 NOTE — Telephone Encounter (Signed)
-----   Message from Hoy Register, MD sent at 05/05/2021  1:52 PM EDT ----- Please inform the patient that labs are normal. Thank you.

## 2021-07-29 ENCOUNTER — Other Ambulatory Visit: Payer: Self-pay | Admitting: Family Medicine

## 2021-07-29 DIAGNOSIS — I152 Hypertension secondary to endocrine disorders: Secondary | ICD-10-CM

## 2021-07-29 DIAGNOSIS — E1159 Type 2 diabetes mellitus with other circulatory complications: Secondary | ICD-10-CM

## 2021-07-29 DIAGNOSIS — E1142 Type 2 diabetes mellitus with diabetic polyneuropathy: Secondary | ICD-10-CM

## 2021-07-29 NOTE — Telephone Encounter (Signed)
Requested medication (s) are due for refill today: no  Requested medication (s) are on the active medication list: no  Last refill:  05/03/21 #90 1 RF for both meds  Future visit scheduled: yes  Notes to clinic:  another PCP listed, appears pt is a pt of [facility]-    Requested Prescriptions  Pending Prescriptions Disp Refills   olmesartan-hydrochlorothiazide (BENICAR HCT) 20-12.5 MG tablet [Pharmacy Med Name: olmesartan 20 mg-hydrochlorothiazide 12.5 mg tablet] 90 tablet 1    Sig: Take 1 tablet by mouth every morning.     Cardiovascular: ARB + Diuretic Combos Failed - 07/29/2021  9:46 AM      Failed - Last BP in normal range    BP Readings from Last 1 Encounters:  05/03/21 (!) 151/89          Passed - K in normal range and within 180 days    Potassium  Date Value Ref Range Status  05/03/2021 5.0 3.5 - 5.2 mmol/L Final          Passed - Na in normal range and within 180 days    Sodium  Date Value Ref Range Status  05/03/2021 140 134 - 144 mmol/L Final          Passed - Cr in normal range and within 180 days    Creatinine, Ser  Date Value Ref Range Status  05/03/2021 1.15 0.76 - 1.27 mg/dL Final          Passed - Ca in normal range and within 180 days    Calcium  Date Value Ref Range Status  05/03/2021 9.9 8.7 - 10.2 mg/dL Final          Passed - Patient is not pregnant      Passed - Valid encounter within last 6 months    Recent Outpatient Visits           2 months ago Type 2 diabetes mellitus with diabetic polyneuropathy, with long-term current use of insulin (HCC)   [facility] [place], Odette Horns, MD       Future Appointments             In 5 days Hoy Register, MD [facility]             gabapentin (NEURONTIN) 100 MG capsule [Pharmacy Med Name: gabapentin 100 mg capsule] 180 capsule 1    Sig: Take 2 capsules (200 mg total) by mouth at bedtime.     Neurology: Anticonvulsants - gabapentin  Passed - 07/29/2021  9:46 AM      Passed - Valid encounter within last 12 months    Recent Outpatient Visits           2 months ago Type 2 diabetes mellitus with diabetic polyneuropathy, with long-term current use of insulin (HCC)   [facility] Hoy Register, MD       Future Appointments             In 5 days Hoy Register, MD [facility]

## 2021-08-03 ENCOUNTER — Encounter: Payer: Self-pay | Admitting: Family Medicine

## 2021-08-03 ENCOUNTER — Telehealth: Payer: Self-pay | Admitting: Family Medicine

## 2021-08-03 ENCOUNTER — Other Ambulatory Visit: Payer: Self-pay

## 2021-08-03 ENCOUNTER — Ambulatory Visit: Payer: Medicaid Other | Attending: Family Medicine | Admitting: Family Medicine

## 2021-08-03 VITALS — BP 134/91 | HR 82 | Ht 70.0 in | Wt 278.0 lb

## 2021-08-03 DIAGNOSIS — F320 Major depressive disorder, single episode, mild: Secondary | ICD-10-CM

## 2021-08-03 DIAGNOSIS — E1142 Type 2 diabetes mellitus with diabetic polyneuropathy: Secondary | ICD-10-CM | POA: Insufficient documentation

## 2021-08-03 DIAGNOSIS — Z1211 Encounter for screening for malignant neoplasm of colon: Secondary | ICD-10-CM

## 2021-08-03 DIAGNOSIS — E785 Hyperlipidemia, unspecified: Secondary | ICD-10-CM

## 2021-08-03 DIAGNOSIS — E1159 Type 2 diabetes mellitus with other circulatory complications: Secondary | ICD-10-CM

## 2021-08-03 DIAGNOSIS — E1169 Type 2 diabetes mellitus with other specified complication: Secondary | ICD-10-CM

## 2021-08-03 DIAGNOSIS — Z794 Long term (current) use of insulin: Secondary | ICD-10-CM

## 2021-08-03 DIAGNOSIS — I152 Hypertension secondary to endocrine disorders: Secondary | ICD-10-CM

## 2021-08-03 LAB — POCT GLYCOSYLATED HEMOGLOBIN (HGB A1C): HbA1c, POC (controlled diabetic range): 6.8 % (ref 0.0–7.0)

## 2021-08-03 LAB — GLUCOSE, POCT (MANUAL RESULT ENTRY): POC Glucose: 121 mg/dl — AB (ref 70–99)

## 2021-08-03 MED ORDER — METFORMIN HCL 1000 MG PO TABS: 1000.0000 mg | ORAL_TABLET | Freq: Two times a day (BID) | ORAL | 1 refills | Status: DC

## 2021-08-03 MED ORDER — ATORVASTATIN CALCIUM 20 MG PO TABS: 20.0000 mg | ORAL_TABLET | Freq: Every morning | ORAL | 1 refills | Status: DC

## 2021-08-03 MED ORDER — OLMESARTAN MEDOXOMIL-HCTZ 20-12.5 MG PO TABS: 1.0000 | ORAL_TABLET | Freq: Every morning | ORAL | 1 refills | Status: DC

## 2021-08-03 MED ORDER — GABAPENTIN 100 MG PO CAPS: 200.0000 mg | ORAL_CAPSULE | Freq: Every day | ORAL | 1 refills | Status: DC

## 2021-08-03 NOTE — Assessment & Plan Note (Signed)
Diabetes is controlled with A1c of 6.5 Continue metformin I have discussed the role of GLP-1 and he is hesitant at this time but would like to think about this He will be an ideal candidate for this due to cardiovascular and weight loss benefit Continue gabapentin for neuropathy Counseled on Diabetic diet, my plate method, 191 minutes of moderate intensity exercise/week Blood sugar logs with fasting goals of 80-120 mg/dl, random of less than 478 and in the event of sugars less than 60 mg/dl or greater than 295 mg/dl encouraged to notify the clinic. Advised on the need for annual eye exams, annual foot exams, Pneumonia vaccine.

## 2021-08-03 NOTE — Progress Notes (Signed)
Subjective:  Patient ID: Joseph Mckee, male    DOB: 29-Nov-1965  Age: 56 y.o. MRN: [id number]  CC: Diabetes   HPI Joseph Mckee is a 56 y.o. year old male with a history of type 2 diabetes mellitus (A1c 6.5), diabetic neuropathy, hypertension, hyperlipidemia, mood disorder.  Interval History: He has lost 10 lbs since his last visit and endorses working on a diabetic diet and his lifestyle.  He has had no hypoglycemic episodes or visual concerns and remains on metformin.  Neuropathy is controlled on gabapentin. For his blood pressure he is doing well on his antihypertensives with no adverse effects from his medication and he is on a statin which he is tolerating well.  [facility] at his last visit per his request for management of his depression and mood disorder however he never got an appointment from them.  The LCSW in the clinic had also reached out to the patient and per notes he would be referred to another practice if he could not secure an appointment. Today he informs me he is running low on his behavioral health medications. Past Medical History:  Diagnosis Date   Depression    History of acute renal failure    SECONDARY "CRACK " COCAINE   History of cocaine abuse ([place])    PT STATES ,01-20-2014, HAS BEEN CLEAN SINCE 2008 BUT HAD RELAPSE IN 2012.  [place] .   History of pulmonary edema    SECONDARY TO "CRACK COCAINE"   Hyperlipidemia    Hypertension    Mood disorder ([place])    Phimosis     Past Surgical History:  Procedure Laterality Date   CIRCUMCISION N/A 01/22/2014   Procedure: CIRCUMCISION ADULT;  Surgeon: Arvil Persons, MD;  Location: [facility] [address];  Service: Urology;  Laterality: N/A;   DENTAL SURGERY      No family history on file.  No Known Allergies  Outpatient Medications Prior to Visit  Medication Sig Dispense Refill   Accu-Chek Softclix Lancets lancets Use as instructed 100  each 12   Blood Glucose Monitoring Suppl (ACCU-CHEK GUIDE) w/Device KIT Check blood sugar 3 times daily. 1 kit 0   divalproex (DEPAKOTE ER) 500 MG 24 hr tablet Take 500 mg by mouth every morning.     glucose blood (ACCU-CHEK GUIDE) test strip Use as instructed 100 each 12   atorvastatin (LIPITOR) 20 MG tablet Take 1 tablet (20 mg total) by mouth every morning. 90 tablet 1   gabapentin (NEURONTIN) 100 MG capsule Take 2 capsules (200 mg total) by mouth at bedtime. 180 capsule 0   metFORMIN (GLUCOPHAGE) 1000 MG tablet Take 1 tablet (1,000 mg total) by mouth 2 (two) times daily with a meal. 180 tablet 1   olmesartan-hydrochlorothiazide (BENICAR HCT) 20-12.5 MG tablet Take 1 tablet by mouth every morning. 30 tablet 0   No facility-administered medications prior to visit.     ROS Review of Systems  Constitutional:  Negative for activity change and appetite change.  HENT:  Negative for sinus pressure and sore throat.   Eyes:  Negative for visual disturbance.  Respiratory:  Negative for cough, chest tightness and shortness of breath.   Cardiovascular:  Negative for chest pain and leg swelling.  Gastrointestinal:  Negative for abdominal distention, abdominal pain, constipation and diarrhea.  Endocrine: Negative.   Genitourinary:  Negative for dysuria.  Musculoskeletal:  Negative for joint swelling and myalgias.  Skin:  Negative for rash.  Allergic/Immunologic: Negative.   Neurological:  Negative for weakness, light-headedness and numbness.  Psychiatric/Behavioral:  Negative for dysphoric mood and suicidal ideas.    Objective:  BP (!) 134/91    Pulse 82    Ht _0  (1.778 m)    Wt 278 lb (126.1 kg)    SpO2 98%    BMI 39.89 kg/m   BP/Weight 08/03/2021 05/03/2021 1/61/0960  Systolic BP 454 098 119  Diastolic BP 91 89 82  Wt. (Lbs) 278 288.2 271  BMI 39.89 41.35 38.88      Physical Exam Constitutional:      Appearance: He is well-developed. He is obese.  Cardiovascular:     Rate and  Rhythm: Normal rate.     Heart sounds: Normal heart sounds. No murmur heard. Pulmonary:     Effort: Pulmonary effort is normal.     Breath sounds: Normal breath sounds. No wheezing or rales.  Chest:     Chest wall: No tenderness.  Abdominal:     General: Bowel sounds are normal. There is no distension.     Palpations: Abdomen is soft. There is no mass.     Tenderness: There is no abdominal tenderness.  Musculoskeletal:        General: Normal range of motion.     Right lower leg: No edema.     Left lower leg: No edema.  Neurological:     Mental Status: He is alert and oriented to person, place, and time.  Psychiatric:        Mood and Affect: Mood normal.    CMP Latest Ref Rng & Units 05/03/2021 01/22/2014  Glucose 70 - 99 mg/dL 108(H) 88  BUN 6 - 24 mg/dL 11 -  Creatinine 0.76 - 1.27 mg/dL 1.15 -  Sodium 134 - 144 mmol/L 140 140  Potassium 3.5 - 5.2 mmol/L 5.0 3.8  Chloride 96 - 106 mmol/L 103 -  CO2 20 - 29 mmol/L 24 -  Calcium 8.7 - 10.2 mg/dL 9.9 -  Total Protein 6.0 - 8.5 g/dL 7.4 -  Total Bilirubin 0.0 - 1.2 mg/dL 0.4 -  Alkaline Phos 44 - 121 IU/L 121 -  AST 0 - 40 IU/L 24 -  ALT 0 - 44 IU/L 35 -    Lipid Panel     Component Value Date/Time   CHOL 157 05/03/2021 1024   TRIG 145 05/03/2021 1024   HDL 37 (L) 05/03/2021 1024   LDLCALC 94 05/03/2021 1024    CBC    Component Value Date/Time   HGB 13.6 01/22/2014 1049   HCT 40.0 01/22/2014 1049    Lab Results  Component Value Date   HGBA1C 6.8 08/03/2021    Assessment & Plan:   Problem List Items Addressed This Visit       Cardiovascular and Mediastinum   Hypertension associated with diabetes ([place])    Slight diastolic elevation No regimen change today Continue to work on lifestyle and weight loss Will make regimen change if still above goal at next visit Counseled on blood pressure goal of less than 130/80, low-sodium, DASH diet, medication compliance, 150 minutes of moderate intensity exercise per  week. Discussed medication compliance, adverse effects.       Relevant Medications   atorvastatin (LIPITOR) 20 MG tablet   metFORMIN (GLUCOPHAGE) 1000 MG tablet   olmesartan-hydrochlorothiazide (BENICAR HCT) 20-12.5 MG tablet     Endocrine   Hyperlipidemia associated with type 2 diabetes mellitus ([place])    Controlled Continue statin Low-cholesterol diet  Relevant Medications   atorvastatin (LIPITOR) 20 MG tablet   metFORMIN (GLUCOPHAGE) 1000 MG tablet   olmesartan-hydrochlorothiazide (BENICAR HCT) 20-12.5 MG tablet   Type 2 diabetes mellitus with diabetic polyneuropathy, with long-term current use of insulin (HCC) - Primary    Diabetes is controlled with A1c of 6.5 Continue metformin I have discussed the role of GLP-1 and he is hesitant at this time but would like to think about this He will be an ideal candidate for this due to cardiovascular and weight loss benefit Continue gabapentin for neuropathy Counseled on Diabetic diet, my plate method, 225 minutes of moderate intensity exercise/week Blood sugar logs with fasting goals of 80-120 mg/dl, random of less than 180 and in the event of sugars less than 60 mg/dl or greater than 400 mg/dl encouraged to notify the clinic. Advised on the need for annual eye exams, annual foot exams, Pneumonia vaccine.       Relevant Medications   atorvastatin (LIPITOR) 20 MG tablet   gabapentin (NEURONTIN) 100 MG capsule   metFORMIN (GLUCOPHAGE) 1000 MG tablet   olmesartan-hydrochlorothiazide (BENICAR HCT) 20-12.5 MG tablet   Other Relevant Orders   POCT glucose (manual entry) (Completed)   POCT glycosylated hemoglobin (Hb A1C) (Completed)     Other   Depression    Previously followed by [facility] per request at his last visit but he is yet to hear from them I have sent a message to the LCSW to follow-up on this and refer him to another practice if we are unable to secure an  appointment with [facility]      Other Visit Diagnoses     Screening for colon cancer       Relevant Orders   Ambulatory referral to Gastroenterology        Health Care Maintenance: He states he received his PCV 20 with previous PCP a year ago however further research into the chart reveals he received Tdap in 06/2020 with no record of PCV 20.  We will discuss the need for PCV 20 again at his next visit. Meds ordered this encounter  Medications   atorvastatin (LIPITOR) 20 MG tablet    Sig: Take 1 tablet (20 mg total) by mouth every morning.    Dispense:  90 tablet    Refill:  1   gabapentin (NEURONTIN) 100 MG capsule    Sig: Take 2 capsules (200 mg total) by mouth at bedtime.    Dispense:  180 capsule    Refill:  1    This prescription was filled on 07/29/2021. Any refills authorized will be placed on file.   metFORMIN (GLUCOPHAGE) 1000 MG tablet    Sig: Take 1 tablet (1,000 mg total) by mouth 2 (two) times daily with a meal.    Dispense:  180 tablet    Refill:  1   olmesartan-hydrochlorothiazide (BENICAR HCT) 20-12.5 MG tablet    Sig: Take 1 tablet by mouth every morning.    Dispense:  90 tablet    Refill:  1    Follow-up: Return in about 6 months (around 01/31/2022) for Chronic medical conditions.       Charlott Rakes, MD, FAAFP. [facility] [place], [place]   08/03/2021, 11:11 AM

## 2021-08-03 NOTE — Telephone Encounter (Signed)
Can you please follow-up with [facility] regarding this patient's appointment?  He is willing to be referred someplace else if they do not have an appointment as he is running out of his medications.  Thank you

## 2021-08-03 NOTE — Patient Instructions (Signed)

## 2021-08-03 NOTE — Assessment & Plan Note (Addendum)
Previously followed by [facility] Referred [facility] per request at his last visit but he is yet to hear from them I have sent a message to the LCSW to follow-up on this and refer him to another practice if we are unable to secure an appointment with [facility]

## 2021-08-03 NOTE — Assessment & Plan Note (Signed)
Slight diastolic elevation No regimen change today Continue to work on lifestyle and weight loss Will make regimen change if still above goal at next visit Counseled on blood pressure goal of less than 130/80, low-sodium, DASH diet, medication compliance, 150 minutes of moderate intensity exercise per week. Discussed medication compliance, adverse effects.

## 2021-08-03 NOTE — Assessment & Plan Note (Signed)
Controlled Continue statin Low-cholesterol diet

## 2021-08-07 NOTE — Telephone Encounter (Signed)
Pt just following up re, visit per outreach phone call, tell [facility] that  had appt with Behavorial last Friday and everything is well, willing be seeing them  ongoing

## 2022-01-23 ENCOUNTER — Other Ambulatory Visit: Payer: Self-pay | Admitting: Family Medicine

## 2022-01-23 DIAGNOSIS — E1169 Type 2 diabetes mellitus with other specified complication: Secondary | ICD-10-CM

## 2022-01-30 ENCOUNTER — Other Ambulatory Visit: Payer: Self-pay | Admitting: Family Medicine

## 2022-01-30 DIAGNOSIS — E1142 Type 2 diabetes mellitus with diabetic polyneuropathy: Secondary | ICD-10-CM

## 2022-02-01 ENCOUNTER — Ambulatory Visit: Payer: Medicaid Other | Attending: Family Medicine | Admitting: Pharmacist

## 2022-02-01 ENCOUNTER — Encounter: Payer: Self-pay | Admitting: Family Medicine

## 2022-02-01 ENCOUNTER — Ambulatory Visit: Payer: Medicaid Other | Attending: Family Medicine | Admitting: Family Medicine

## 2022-02-01 VITALS — BP 108/75 | HR 80 | Temp 98.0°F | Ht 70.0 in | Wt 291.4 lb

## 2022-02-01 DIAGNOSIS — E1159 Type 2 diabetes mellitus with other circulatory complications: Secondary | ICD-10-CM

## 2022-02-01 DIAGNOSIS — Z794 Long term (current) use of insulin: Secondary | ICD-10-CM

## 2022-02-01 DIAGNOSIS — F320 Major depressive disorder, single episode, mild: Secondary | ICD-10-CM | POA: Insufficient documentation

## 2022-02-01 DIAGNOSIS — Z87891 Personal history of nicotine dependence: Secondary | ICD-10-CM | POA: Insufficient documentation

## 2022-02-01 DIAGNOSIS — E1140 Type 2 diabetes mellitus with diabetic neuropathy, unspecified: Secondary | ICD-10-CM | POA: Insufficient documentation

## 2022-02-01 DIAGNOSIS — E1142 Type 2 diabetes mellitus with diabetic polyneuropathy: Secondary | ICD-10-CM

## 2022-02-01 DIAGNOSIS — Z79899 Other long term (current) drug therapy: Secondary | ICD-10-CM

## 2022-02-01 DIAGNOSIS — E785 Hyperlipidemia, unspecified: Secondary | ICD-10-CM | POA: Insufficient documentation

## 2022-02-01 DIAGNOSIS — I10 Essential (primary) hypertension: Secondary | ICD-10-CM | POA: Insufficient documentation

## 2022-02-01 DIAGNOSIS — E1169 Type 2 diabetes mellitus with other specified complication: Secondary | ICD-10-CM | POA: Diagnosis not present

## 2022-02-01 DIAGNOSIS — I152 Hypertension secondary to endocrine disorders: Secondary | ICD-10-CM

## 2022-02-01 DIAGNOSIS — Z1211 Encounter for screening for malignant neoplasm of colon: Secondary | ICD-10-CM | POA: Diagnosis not present

## 2022-02-01 DIAGNOSIS — F39 Unspecified mood [affective] disorder: Secondary | ICD-10-CM | POA: Diagnosis not present

## 2022-02-01 DIAGNOSIS — F17200 Nicotine dependence, unspecified, uncomplicated: Secondary | ICD-10-CM | POA: Insufficient documentation

## 2022-02-01 DIAGNOSIS — Z7984 Long term (current) use of oral hypoglycemic drugs: Secondary | ICD-10-CM | POA: Insufficient documentation

## 2022-02-01 DIAGNOSIS — Z131 Encounter for screening for diabetes mellitus: Secondary | ICD-10-CM | POA: Diagnosis not present

## 2022-02-01 LAB — GLUCOSE, POCT (MANUAL RESULT ENTRY): POC Glucose: 114 mg/dl — AB (ref 70–99)

## 2022-02-01 LAB — POCT GLYCOSYLATED HEMOGLOBIN (HGB A1C): HbA1c, POC (controlled diabetic range): 7 % (ref 0.0–7.0)

## 2022-02-01 MED ORDER — OZEMPIC (0.25 OR 0.5 MG/DOSE) 2 MG/1.5ML ~~LOC~~ SOPN: 0.5000 mg | PEN_INJECTOR | SUBCUTANEOUS | 6 refills | Status: DC

## 2022-02-01 MED ORDER — OLMESARTAN MEDOXOMIL-HCTZ 20-12.5 MG PO TABS
1.0000 | ORAL_TABLET | Freq: Every morning | ORAL | 1 refills | Status: DC
Start: 2022-02-01 — End: 2022-09-24

## 2022-02-01 MED ORDER — OZEMPIC (0.25 OR 0.5 MG/DOSE) 2 MG/1.5ML ~~LOC~~ SOPN: 0.2500 mg | PEN_INJECTOR | SUBCUTANEOUS | 0 refills | Status: DC

## 2022-02-01 MED ORDER — METFORMIN HCL 500 MG PO TABS: 500.0000 mg | ORAL_TABLET | Freq: Two times a day (BID) | ORAL | 1 refills | Status: DC

## 2022-02-01 MED ORDER — ATORVASTATIN CALCIUM 20 MG PO TABS: 20.0000 mg | ORAL_TABLET | Freq: Every morning | ORAL | 1 refills | Status: DC

## 2022-02-01 NOTE — Patient Instructions (Signed)
Semaglutide Injection What is this medication? SEMAGLUTIDE (SEM a GLOO tide) treats type 2 diabetes. It works by increasing insulin levels in your body, which decreases your blood sugar (glucose). It also reduces the amount of sugar released into the blood and slows down your digestion. It can also be used to lower the risk of heart attack and stroke in people with type 2 diabetes. Changes to diet and exercise are often combined with this medication. This medicine may be used for other purposes; ask your health care provider or pharmacist if you have questions. COMMON BRAND NAME(S): OZEMPIC What should I tell my care team before I take this medication? They need to know if you have any of these conditions: Endocrine tumors (MEN 2) or if someone in your family had these tumors Eye disease, vision problems History of pancreatitis Kidney disease Stomach problems Thyroid cancer or if someone in your family had thyroid cancer An unusual or allergic reaction to semaglutide, other medications, foods, dyes, or preservatives Pregnant or trying to get pregnant Breast-feeding How should I use this medication? This medication is for injection under the skin of your upper leg (thigh), stomach area, or upper arm. It is given once every week (every 7 days). You will be taught how to prepare and give this medication. Use exactly as directed. Take your medication at regular intervals. Do not take it more often than directed. If you use this medication with insulin, you should inject this medication and the insulin separately. Do not mix them together. Do not give the injections right next to each other. Change (rotate) injection sites with each injection. It is important that you put your used needles and syringes in a special sharps container. Do not put them in a trash can. If you do not have a sharps container, call your pharmacist or care team to get one. A special MedGuide will be given to you by the  pharmacist with each prescription and refill. Be sure to read this information carefully each time. This medication comes with INSTRUCTIONS FOR USE. Ask your pharmacist for directions on how to use this medication. Read the information carefully. Talk to your pharmacist or care team if you have questions. Talk to your care team about the use of this medication in children. Special care may be needed. Overdosage: If you think you have taken too much of this medicine contact a poison control center or emergency room at once. NOTE: This medicine is only for you. Do not share this medicine with others. What if I miss a dose? If you miss a dose, take it as soon as you can within 5 days after the missed dose. Then take your next dose at your regular weekly time. If it has been longer than 5 days after the missed dose, do not take the missed dose. Take the next dose at your regular time. Do not take double or extra doses. If you have questions about a missed dose, contact your care team for advice. What may interact with this medication? Other medications for diabetes Many medications may cause changes in blood sugar, these include: Alcohol containing beverages Antiviral medications for HIV or AIDS Aspirin and aspirin-like medications Certain medications for blood pressure, heart disease, irregular heart beat Chromium Diuretics Male hormones, such as estrogens or progestins, birth control pills Fenofibrate Gemfibrozil Isoniazid Lanreotide Male hormones or anabolic steroids MAOIs like Carbex, Eldepryl, Marplan, Nardil, and Parnate Medications for weight loss Medications for allergies, asthma, cold, or cough Medications for depression,   anxiety, or psychotic disturbances Niacin Nicotine NSAIDs, medications for pain and inflammation, like ibuprofen or naproxen Octreotide Pasireotide Pentamidine Phenytoin Probenecid Quinolone antibiotics such as ciprofloxacin, levofloxacin, ofloxacin Some  herbal dietary supplements Steroid medications such as prednisone or cortisone Sulfamethoxazole; trimethoprim Thyroid hormones Some medications can hide the warning symptoms of low blood sugar (hypoglycemia). You may need to monitor your blood sugar more closely if you are taking one of these medications. These include: Beta-blockers, often used for high blood pressure or heart problems (examples include atenolol, metoprolol, propranolol) Clonidine Guanethidine Reserpine This list may not describe all possible interactions. Give your health care provider a list of all the medicines, herbs, non-prescription drugs, or dietary supplements you use. Also tell them if you smoke, drink alcohol, or use illegal drugs. Some items may interact with your medicine. What should I watch for while using this medication? Visit your care team for regular checks on your progress. Drink plenty of fluids while taking this medication. Check with your care team if you get an attack of severe diarrhea, nausea, and vomiting. The loss of too much body fluid can make it dangerous for you to take this medication. A test called the HbA1C (A1C) will be monitored. This is a simple blood test. It measures your blood sugar control over the last 2 to 3 months. You will receive this test every 3 to 6 months. Learn how to check your blood sugar. Learn the symptoms of low and high blood sugar and how to manage them. Always carry a quick-source of sugar with you in case you have symptoms of low blood sugar. Examples include hard sugar candy or glucose tablets. Make sure others know that you can choke if you eat or drink when you develop serious symptoms of low blood sugar, such as seizures or unconsciousness. They must get medical help at once. Tell your care team if you have high blood sugar. You might need to change the dose of your medication. If you are sick or exercising more than usual, you might need to change the dose of your  medication. Do not skip meals. Ask your care team if you should avoid alcohol. Many nonprescription cough and cold products contain sugar or alcohol. These can affect blood sugar. Pens should never be shared. Even if the needle is changed, sharing may result in passing of viruses like hepatitis or HIV. Wear a medical ID bracelet or chain, and carry a card that describes your disease and details of your medication and dosage times. Do not become pregnant while taking this medication. Women should inform their care team if they wish to become pregnant or think they might be pregnant. There is a potential for serious side effects to an unborn child. Talk to your care team for more information. What side effects may I notice from receiving this medication? Side effects that you should report to your care team as soon as possible: Allergic reactions--skin rash, itching, hives, swelling of the face, lips, tongue, or throat Change in vision Dehydration--increased thirst, dry mouth, feeling faint or lightheaded, headache, dark yellow or brown urine Gallbladder problems--severe stomach pain, nausea, vomiting, fever Heart palpitations--rapid, pounding, or irregular heartbeat Kidney injury--decrease in the amount of urine, swelling of the ankles, hands, or feet Pancreatitis--severe stomach pain that spreads to your back or gets worse after eating or when touched, fever, nausea, vomiting Thyroid cancer--new mass or lump in the neck, pain or trouble swallowing, trouble breathing, hoarseness Side effects that usually do not require medical   attention (report to your care team if they continue or are bothersome): Diarrhea Loss of appetite Nausea Stomach pain Vomiting This list may not describe all possible side effects. Call your doctor for medical advice about side effects. You may report side effects to FDA at 1-800-FDA-1088. Where should I keep my medication? Keep out of the reach of children. Store  unopened pens in a refrigerator between 2 and 8 degrees C (36 and 46 degrees F). Do not freeze. Protect from light and heat. After you first use the pen, it can be stored for 56 days at room temperature between 15 and 30 degrees C (59 and 86 degrees F) or in a refrigerator. Throw away your used pen after 56 days or after the expiration date, whichever comes first. Do not store your pen with the needle attached. If the needle is left on, medication may leak from the pen. NOTE: This sheet is a summary. It may not cover all possible information. If you have questions about this medicine, talk to your doctor, pharmacist, or health care provider.  2023 Elsevier/Gold Standard (2020-10-06 00:00:00)  

## 2022-02-01 NOTE — Progress Notes (Signed)
Patient was educated on the use of the Ozempic pen. Reviewed necessary supplies and operation of the pen. Also reviewed goal blood glucose levels. Patient was able to demonstrate use. All questions and concerns were addressed. ? ?Time spent counseling: 15 minutes ? ?Luke Van Ausdall, PharmD, BCACP, CPP ?Clinical Pharmacist ?[facility] ?[phone] ? ? ?

## 2022-02-01 NOTE — Progress Notes (Signed)
Medication refills

## 2022-02-01 NOTE — Progress Notes (Signed)
Subjective:  Patient ID: Joseph Mckee, male    DOB: [birth year]  Age: 56 y.o. MRN: [id number]  CC: Diabetes   HPI [name] is a 56 y.o. year old male with a history of type 2 diabetes mellitus (A1c 7.0), diabetic neuropathy, hypertension, hyperlipidemia, mood disorder, previous tobacco abuse (about 15 pack year)  Interval History:  Endorses adherence with metformin and denies hypoglycemic episodes.  A1c is 7.0 from 6.8 previously.  He endorses not exercising as he previously did.  Weight is up 13 pounds from his last visit 6 months ago. Denies presence of visual symptoms and his neuropathy is controlled on gabapentin. Adherent with his statin and antihypertensive and has no adverse effects from his medications.  Mood disorder is [facility] by [facility]. Denies additional concerns today.  Past Medical History:  Diagnosis Date   Depression    History of acute renal failure    SECONDARY "CRACK " COCAINE   History of cocaine abuse ([place])    PT STATES ,01-20-2014, HAS BEEN CLEAN SINCE 2008 BUT HAD RELAPSE IN 2012.  [place] .   History of pulmonary edema    SECONDARY TO "CRACK COCAINE"   Hyperlipidemia    Hypertension    Mood disorder ([place])    Phimosis     Past Surgical History:  Procedure Laterality Date   CIRCUMCISION N/A 01/22/2014   Procedure: CIRCUMCISION ADULT;  Surgeon: Arvil Persons, MD;  Location: [facility];  Service: Urology;  Laterality: N/A;   DENTAL SURGERY      History reviewed. No pertinent family history.  Social History   Socioeconomic History   Marital status: Single    Spouse name: Not on file   Number of children: Not on file   Years of education: Not on file   Highest education level: Not on file  Occupational History   Not on file  Tobacco Use   Smoking status: Former    Packs/day: 0.50    Years: 10.00    Total pack years: 5.00    Types: Cigarettes   Smokeless tobacco: Never   Vaping Use   Vaping Use: Every day  Substance and Sexual Activity   Alcohol use: No    Comment: HX ABUSE   Drug use: No    Comment: HX  "CRACK" COCAINE ABUSE--  PT STATES CLEAN SINCE 2008 BUT HAD RELAPSE IN 2012 ([place])   Sexual activity: Not on file  Other Topics Concern   Not on file  Social History Narrative   Not on file   Social Determinants of Health   Financial Resource Strain: Not on file  Food Insecurity: Not on file  Transportation Needs: Not on file  Physical Activity: Not on file  Stress: Not on file  Social Connections: Not on file    No Known Allergies  Outpatient Medications Prior to Visit  Medication Sig Dispense Refill   Accu-Chek Softclix Lancets lancets Use as instructed 100 each 12   Blood Glucose Monitoring Suppl (ACCU-CHEK GUIDE) w/Device KIT Check blood sugar 3 times daily. 1 kit 0   divalproex (DEPAKOTE ER) 500 MG 24 hr tablet Take 500 mg by mouth every morning.     gabapentin (NEURONTIN) 100 MG capsule Take 2 capsules (200 mg total) by mouth at bedtime. 180 capsule 1   glucose blood (ACCU-CHEK GUIDE) test strip Use as instructed 100 each 12   atorvastatin (LIPITOR) 20 MG tablet Take 1 tablet (20 mg total)  by mouth every morning. 30 tablet 0   metFORMIN (GLUCOPHAGE) 1000 MG tablet Take 1 tablet (1,000 mg total) by mouth 2 (two) times daily with a meal. 60 tablet 0   olmesartan-hydrochlorothiazide (BENICAR HCT) 20-12.5 MG tablet Take 1 tablet by mouth every morning. 90 tablet 1   No facility-administered medications prior to visit.     ROS Review of Systems  Constitutional:  Negative for activity change and appetite change.  HENT:  Negative for sinus pressure and sore throat.   Eyes:  Negative for visual disturbance.  Respiratory:  Negative for cough, chest tightness and shortness of breath.   Cardiovascular:  Negative for chest pain and leg swelling.  Gastrointestinal:  Negative for abdominal distention,  abdominal pain, constipation and diarrhea.  Endocrine: Negative.   Genitourinary:  Negative for dysuria.  Musculoskeletal:  Negative for joint swelling and myalgias.  Skin:  Negative for rash.  Allergic/Immunologic: Negative.   Neurological:  Negative for weakness, light-headedness and numbness.  Psychiatric/Behavioral:  Negative for dysphoric mood and suicidal ideas.     Objective:  BP 108/75   Pulse 80   Temp 98 F (36.7 C) (Oral)   Ht 5' 10" (1.778 m)   Wt 291 lb 6.4 oz (132.2 kg)   SpO2 95%   BMI 41.81 kg/m      02/01/2022    8:37 AM 08/03/2021    9:12 AM 05/03/2021    9:18 AM  BP/Weight  Systolic BP 390 300 923  Diastolic BP 75 91 89  Wt. (Lbs) 291.4 278 288.2  BMI 41.81 kg/m2 39.89 kg/m2 41.35 kg/m2      Physical Exam Constitutional:      Appearance: He is well-developed. He is obese.  Cardiovascular:     Rate and Rhythm: Normal rate.     Heart sounds: Normal heart sounds. No murmur heard. Pulmonary:     Effort: Pulmonary effort is normal.     Breath sounds: Normal breath sounds. No wheezing or rales.  Chest:     Chest wall: No tenderness.  Abdominal:     General: Bowel sounds are normal. There is no distension.     Palpations: Abdomen is soft. There is no mass.     Tenderness: There is no abdominal tenderness.  Musculoskeletal:        General: Normal range of motion.     Right lower leg: No edema.     Left lower leg: No edema.  Neurological:     Mental Status: He is alert and oriented to person, place, and time.  Psychiatric:        Mood and Affect: Mood normal.    Diabetic Foot Exam - Simple   Simple Foot Form Diabetic Foot exam was performed with the following findings: Yes 02/01/2022  9:05 AM  Visual Inspection No deformities, no ulcerations, no other skin breakdown bilaterally: Yes Sensation Testing Intact to touch and monofilament testing bilaterally: Yes Pulse Check Posterior Tibialis and Dorsalis pulse intact bilaterally: Yes Comments         Latest Ref Rng & Units 05/03/2021   10:24 AM 01/22/2014   10:49 AM  CMP  Glucose 70 - 99 mg/dL 108  88   BUN 6 - 24 mg/dL 11    Creatinine 0.76 - 1.27 mg/dL 1.15    Sodium 134 - 144 mmol/L 140  140   Potassium 3.5 - 5.2 mmol/L 5.0  3.8   Chloride 96 - 106 mmol/L 103    CO2 20 - 29 mmol/L  24    Calcium 8.7 - 10.2 mg/dL 9.9    Total Protein 6.0 - 8.5 g/dL 7.4    Total Bilirubin 0.0 - 1.2 mg/dL 0.4    Alkaline Phos 44 - 121 IU/L 121    AST 0 - 40 IU/L 24    ALT 0 - 44 IU/L 35      Lipid Panel     Component Value Date/Time   CHOL 157 05/03/2021 1024   TRIG 145 05/03/2021 1024   HDL 37 (L) 05/03/2021 1024   LDLCALC 94 05/03/2021 1024    CBC    Component Value Date/Time   HGB 13.6 01/22/2014 1049   HCT 40.0 01/22/2014 1049    Lab Results  Component Value Date   HGBA1C 7.0 02/01/2022    Assessment & Plan:  1. Type 2 diabetes mellitus with diabetic polyneuropathy, with long-term current use of insulin (HCC) Controlled with A1c of 7.0 We will add on GLP-1 RA after shared decision making due to cardiovascular, weight loss benefits Metformin dose decreased and will work towards discontinuing metformin at next visit and titrating up his GLP-1 RA to maximum tolerated dose Counseled on Diabetic diet, my plate method, 009 minutes of moderate intensity exercise/week Blood sugar logs with fasting goals of 80-120 mg/dl, random of less than 180 and in the event of sugars less than 60 mg/dl or greater than 400 mg/dl encouraged to notify the clinic. Advised on the need for annual eye exams, annual foot exams, Pneumonia vaccine. - POCT glycosylated hemoglobin (Hb A1C) - POCT glucose (manual entry) - Semaglutide,0.25 or 0.5MG/DOS, (OZEMPIC, 0.25 OR 0.5 MG/DOSE,) 2 MG/1.5ML SOPN; Inject 0.25 mg into the skin once a week. For 4 weeks, then increase to 0.5 mg once a week thereafter  Dispense: 2 mL; Refill: 0 - Semaglutide,0.25 or 0.5MG/DOS, (OZEMPIC, 0.25 OR 0.5 MG/DOSE,) 2 MG/1.5ML  SOPN; Inject 0.5 mg into the skin once a week. After completion of 0.25 mg.  Dispense: 2 mL; Refill: 6 - Microalbumin / creatinine urine ratio - LP+Non-HDL Cholesterol - CMP14+EGFR - Ambulatory referral to Ophthalmology - metFORMIN (GLUCOPHAGE) 500 MG tablet; Take 1 tablet (500 mg total) by mouth 2 (two) times daily with a meal.  Dispense: 180 tablet; Refill: 1  2. Hyperlipidemia associated with type 2 diabetes mellitus (HCC) Controlled Low-cholesterol diet - atorvastatin (LIPITOR) 20 MG tablet; Take 1 tablet (20 mg total) by mouth every morning.  Dispense: 90 tablet; Refill: 1  3. Hypertension associated with diabetes ([place]) Controlled Counseled on blood pressure goal of less than 130/80, low-sodium, DASH diet, medication compliance, 150 minutes of moderate intensity exercise per week. Discussed medication compliance, adverse effects. - olmesartan-hydrochlorothiazide (BENICAR HCT) 20-12.5 MG tablet; Take 1 tablet by mouth every morning.  Dispense: 90 tablet; Refill: 1  4. Screening for colon cancer - Ambulatory referral to Gastroenterology  5. Current mild episode of major depressive disorder without prior episode ([place]) With bipolar disorder Controlled Followed by psychiatry.    Meds ordered this encounter  Medications   Semaglutide,0.25 or 0.5MG/DOS, (OZEMPIC, 0.25 OR 0.5 MG/DOSE,) 2 MG/1.5ML SOPN    Sig: Inject 0.25 mg into the skin once a week. For 4 weeks, then increase to 0.5 mg once a week thereafter    Dispense:  2 mL    Refill:  0   Semaglutide,0.25 or 0.5MG/DOS, (OZEMPIC, 0.25 OR 0.5 MG/DOSE,) 2 MG/1.5ML SOPN    Sig: Inject 0.5 mg into the skin once a week. After completion of 0.25 mg.    Dispense:  2 mL    Refill:  6   atorvastatin (LIPITOR) 20 MG tablet    Sig: Take 1 tablet (20 mg total) by mouth every morning.    Dispense:  90 tablet    Refill:  1   olmesartan-hydrochlorothiazide (BENICAR HCT) 20-12.5 MG tablet    Sig: Take 1 tablet by mouth every morning.     Dispense:  90 tablet    Refill:  1   metFORMIN (GLUCOPHAGE) 500 MG tablet    Sig: Take 1 tablet (500 mg total) by mouth 2 (two) times daily with a meal.    Dispense:  180 tablet    Refill:  1    Dose decrease    Follow-up: Return in about 6 months (around 08/04/2022) for Chronic medical conditions.       Charlott Rakes, MD, FAAFP. [facility] [place], [place]   02/01/2022, 9:08 AM

## 2022-02-02 LAB — MICROALBUMIN / CREATININE URINE RATIO
Creatinine, Urine: 146.7 mg/dL
Microalb/Creat Ratio: 4 mg/g creat (ref 0–29)
Microalbumin, Urine: 5.3 ug/mL

## 2022-02-02 LAB — CMP14+EGFR
ALT: 26 IU/L (ref 0–44)
AST: 25 IU/L (ref 0–40)
Albumin/Globulin Ratio: 1.6 (ref 1.2–2.2)
Albumin: 4.4 g/dL (ref 3.8–4.9)
Alkaline Phosphatase: 105 IU/L (ref 44–121)
BUN/Creatinine Ratio: 13 (ref 9–20)
BUN: 17 mg/dL (ref 6–24)
Bilirubin Total: 0.3 mg/dL (ref 0.0–1.2)
CO2: 22 mmol/L (ref 20–29)
Calcium: 9.6 mg/dL (ref 8.7–10.2)
Chloride: 100 mmol/L (ref 96–106)
Creatinine, Ser: 1.31 mg/dL — ABNORMAL HIGH (ref 0.76–1.27)
Globulin, Total: 2.7 g/dL (ref 1.5–4.5)
Glucose: 108 mg/dL — ABNORMAL HIGH (ref 70–99)
Potassium: 4.7 mmol/L (ref 3.5–5.2)
Sodium: 138 mmol/L (ref 134–144)
Total Protein: 7.1 g/dL (ref 6.0–8.5)
eGFR: 64 mL/min/{1.73_m2} (ref >59)

## 2022-02-02 LAB — LP+NON-HDL CHOLESTEROL
Cholesterol, Total: 164 mg/dL (ref 100–199)
HDL: 38 mg/dL — ABNORMAL LOW (ref >39)
LDL Chol Calc (NIH): 103 mg/dL — ABNORMAL HIGH (ref 0–99)
Total Non-HDL-Chol (LDL+VLDL): 126 mg/dL (ref 0–129)
Triglycerides: 125 mg/dL (ref 0–149)
VLDL Cholesterol Cal: 23 mg/dL (ref 5–40)

## 2022-03-20 ENCOUNTER — Other Ambulatory Visit: Payer: Self-pay | Admitting: Family Medicine

## 2022-03-20 DIAGNOSIS — I152 Hypertension secondary to endocrine disorders: Secondary | ICD-10-CM

## 2022-05-07 ENCOUNTER — Other Ambulatory Visit: Payer: Self-pay | Admitting: Family Medicine

## 2022-05-07 DIAGNOSIS — E1142 Type 2 diabetes mellitus with diabetic polyneuropathy: Secondary | ICD-10-CM

## 2022-05-07 DIAGNOSIS — E1169 Type 2 diabetes mellitus with other specified complication: Secondary | ICD-10-CM

## 2022-07-25 ENCOUNTER — Other Ambulatory Visit: Payer: Self-pay | Admitting: Family Medicine

## 2022-07-25 DIAGNOSIS — E1142 Type 2 diabetes mellitus with diabetic polyneuropathy: Secondary | ICD-10-CM

## 2022-07-25 NOTE — Telephone Encounter (Signed)
Requested Prescriptions  Pending Prescriptions Disp Refills   gabapentin (NEURONTIN) 100 MG capsule [Pharmacy Med Name: gabapentin 100 mg capsule] 180 capsule 0    Sig: TAKE 1 TO 2 CAPSULES BY MOUTH AT BEDTIME     Neurology: Anticonvulsants - gabapentin Failed - 07/25/2022  3:25 PM      Failed - Cr in normal range and within 360 days    Creatinine, Ser  Date Value Ref Range Status  02/01/2022 1.31 (H) 0.76 - 1.27 mg/dL Final         Passed - Completed PHQ-2 or PHQ-9 in the last 360 days      Passed - Valid encounter within last 12 months    Recent Outpatient Visits           5 months ago Type 2 diabetes mellitus with diabetic polyneuropathy, with long-term current use of insulin ([place])   Ragland, [place] L, RPH-CPP   5 months ago Type 2 diabetes mellitus with diabetic polyneuropathy, with long-term current use of insulin ([place])   [place], [place], MD   11 months ago Type 2 diabetes mellitus with diabetic polyneuropathy, with long-term current use of insulin ([place])   C[place], [place], MD   1 year ago Type 2 diabetes mellitus with diabetic polyneuropathy, with long-term current use of insulin ([place])   [place], MD       Future Appointments             In 1 week Charlott Rakes, MD [place]

## 2022-08-07 ENCOUNTER — Ambulatory Visit: Payer: Medicaid Other | Admitting: Family Medicine

## 2022-09-18 ENCOUNTER — Telehealth: Payer: Self-pay | Admitting: *Deleted

## 2022-09-21 NOTE — Telephone Encounter (Signed)
Chart review indicates pt had flex sig by Dr. Deatra Ina on 07/21/10 (as part of a research study)- pt also referred on 08/03/21 and again 02/01/22 for colonoscopy - [facility] shows order closed and no procedure noted - unable to contact pt by phone x 2 and vm full - pt has appt with PCP on 09/24/22 - in-basket note sent to PCP clinical pool r/t current CRC status (attempts and f/u to date.)

## 2022-09-24 ENCOUNTER — Encounter: Payer: Self-pay | Admitting: Family Medicine

## 2022-09-24 ENCOUNTER — Ambulatory Visit: Payer: Medicaid Other | Attending: Family Medicine | Admitting: Family Medicine

## 2022-09-24 VITALS — BP 163/95 | HR 84 | Temp 98.7°F | Ht 70.0 in | Wt 256.2 lb

## 2022-09-24 DIAGNOSIS — I10 Essential (primary) hypertension: Secondary | ICD-10-CM | POA: Diagnosis not present

## 2022-09-24 DIAGNOSIS — Z794 Long term (current) use of insulin: Secondary | ICD-10-CM

## 2022-09-24 DIAGNOSIS — E1159 Type 2 diabetes mellitus with other circulatory complications: Secondary | ICD-10-CM | POA: Diagnosis not present

## 2022-09-24 DIAGNOSIS — E1169 Type 2 diabetes mellitus with other specified complication: Secondary | ICD-10-CM

## 2022-09-24 DIAGNOSIS — Z87891 Personal history of nicotine dependence: Secondary | ICD-10-CM | POA: Diagnosis not present

## 2022-09-24 DIAGNOSIS — Z79899 Other long term (current) drug therapy: Secondary | ICD-10-CM | POA: Insufficient documentation

## 2022-09-24 DIAGNOSIS — G629 Polyneuropathy, unspecified: Secondary | ICD-10-CM | POA: Diagnosis not present

## 2022-09-24 DIAGNOSIS — E1142 Type 2 diabetes mellitus with diabetic polyneuropathy: Secondary | ICD-10-CM | POA: Insufficient documentation

## 2022-09-24 DIAGNOSIS — Z7985 Long-term (current) use of injectable non-insulin antidiabetic drugs: Secondary | ICD-10-CM | POA: Diagnosis not present

## 2022-09-24 DIAGNOSIS — E785 Hyperlipidemia, unspecified: Secondary | ICD-10-CM | POA: Diagnosis not present

## 2022-09-24 DIAGNOSIS — I152 Hypertension secondary to endocrine disorders: Secondary | ICD-10-CM

## 2022-09-24 DIAGNOSIS — Z1211 Encounter for screening for malignant neoplasm of colon: Secondary | ICD-10-CM | POA: Diagnosis not present

## 2022-09-24 DIAGNOSIS — Z7984 Long term (current) use of oral hypoglycemic drugs: Secondary | ICD-10-CM | POA: Diagnosis not present

## 2022-09-24 LAB — POCT GLYCOSYLATED HEMOGLOBIN (HGB A1C): HbA1c, POC (controlled diabetic range): 6.6 % (ref 0.0–7.0)

## 2022-09-24 LAB — GLUCOSE, POCT (MANUAL RESULT ENTRY): POC Glucose: 106 mg/dl — AB (ref 70–99)

## 2022-09-24 MED ORDER — OLMESARTAN MEDOXOMIL-HCTZ 40-25 MG PO TABS: 1.0000 | ORAL_TABLET | Freq: Every day | ORAL | 1 refills | Status: DC

## 2022-09-24 MED ORDER — GABAPENTIN 100 MG PO CAPS: 200.0000 mg | ORAL_CAPSULE | Freq: Every day | ORAL | 1 refills | Status: DC

## 2022-09-24 MED ORDER — OZEMPIC (0.25 OR 0.5 MG/DOSE) 2 MG/1.5ML ~~LOC~~ SOPN: 0.5000 mg | PEN_INJECTOR | SUBCUTANEOUS | 6 refills | Status: DC

## 2022-09-24 MED ORDER — METFORMIN HCL 500 MG PO TABS: 500.0000 mg | ORAL_TABLET | Freq: Two times a day (BID) | ORAL | 1 refills | Status: DC

## 2022-09-24 MED ORDER — ATORVASTATIN CALCIUM 20 MG PO TABS: 20.0000 mg | ORAL_TABLET | Freq: Every morning | ORAL | 1 refills | Status: DC

## 2022-09-24 NOTE — Patient Instructions (Signed)
Managing Your Hypertension Hypertension, also called high blood pressure, is when the force of the blood pressing against the walls of the arteries is too strong. Arteries are blood vessels that carry blood from your heart throughout your body. Hypertension forces the heart to work harder to pump blood and may cause the arteries to become narrow or stiff. Understanding blood pressure readings A blood pressure reading includes a higher number over a lower number: The first, or top, number is called the systolic pressure. It is a measure of the pressure in your arteries as your heart beats. The second, or bottom number, is called the diastolic pressure. It is a measure of the pressure in your arteries as the heart relaxes. For most people, a normal blood pressure is below 120/80. Your personal target blood pressure may vary depending on your medical conditions, your age, and other factors. Blood pressure is classified into four stages. Based on your blood pressure reading, your health care provider may use the following stages to determine what type of treatment you need, if any. Systolic pressure and diastolic pressure are measured in a unit called millimeters of mercury (mmHg). Normal Systolic pressure: below 120. Diastolic pressure: below 80. Elevated Systolic pressure: 120-129. Diastolic pressure: below 80. Hypertension stage 1 Systolic pressure: 130-139. Diastolic pressure: 80-89. Hypertension stage 2 Systolic pressure: 140 or above. Diastolic pressure: 90 or above. How can this condition affect me? Managing your hypertension is very important. Over time, hypertension can damage the arteries and decrease blood flow to parts of the body, including the brain, heart, and kidneys. Having untreated or uncontrolled hypertension can lead to: A heart attack. A stroke. A weakened blood vessel (aneurysm). Heart failure. Kidney damage. Eye damage. Memory and concentration problems. Vascular  dementia. What actions can I take to manage this condition? Hypertension can be managed by making lifestyle changes and possibly by taking medicines. Your health care provider will help you make a plan to bring your blood pressure within a normal range. You may be referred for counseling on a healthy diet and physical activity. Nutrition  Eat a diet that is high in fiber and potassium, and low in salt (sodium), added sugar, and fat. An example eating plan is called the DASH diet. DASH stands for Dietary Approaches to Stop Hypertension. To eat this way: Eat plenty of fresh fruits and vegetables. Try to fill one-half of your plate at each meal with fruits and vegetables. Eat whole grains, such as whole-wheat pasta, brown rice, or whole-grain bread. Fill about one-fourth of your plate with whole grains. Eat low-fat dairy products. Avoid fatty cuts of meat, processed or cured meats, and poultry with skin. Fill about one-fourth of your plate with lean proteins such as fish, chicken without skin, beans, eggs, and tofu. Avoid pre-made and processed foods. These tend to be higher in sodium, added sugar, and fat. Reduce your daily sodium intake. Many people with hypertension should eat less than 1,500 mg of sodium a day. Lifestyle  Work with your health care provider to maintain a healthy body weight or to lose weight. Ask what an ideal weight is for you. Get at least 30 minutes of exercise that causes your heart to beat faster (aerobic exercise) most days of the week. Activities may include walking, swimming, or biking. Include exercise to strengthen your muscles (resistance exercise), such as weight lifting, as part of your weekly exercise routine. Try to do these types of exercises for 30 minutes at least 3 days a week. Do   not use any products that contain nicotine or tobacco. These products include cigarettes, chewing tobacco, and vaping devices, such as e-cigarettes. If you need help quitting, ask your  health care provider. Control any long-term (chronic) conditions you have, such as high cholesterol or diabetes. Identify your sources of stress and find ways to manage stress. This may include meditation, deep breathing, or making time for fun activities. Alcohol use Do not drink alcohol if: Your health care provider tells you not to drink. You are pregnant, may be pregnant, or are planning to become pregnant. If you drink alcohol: Limit how much you have to: 0-1 drink a day for women. 0-2 drinks a day for men. Know how much alcohol is in your drink. In the [place] one drink equals one 12 oz bottle of beer (355 mL), one 5 oz glass of wine (148 mL), or one 1 oz glass of hard liquor (44 mL). Medicines Your health care provider may prescribe medicine if lifestyle changes are not enough to get your blood pressure under control and if: Your systolic blood pressure is 130 or higher. Your diastolic blood pressure is 80 or higher. Take medicines only as told by your health care provider. Follow the directions carefully. Blood pressure medicines must be taken as told by your health care provider. The medicine does not work as well when you skip doses. Skipping doses also puts you at risk for problems. Monitoring Before you monitor your blood pressure: Do not smoke, drink caffeinated beverages, or exercise within 30 minutes before taking a measurement. Use the bathroom and empty your bladder (urinate). Sit quietly for at least 5 minutes before taking measurements. Monitor your blood pressure at home as told by your health care provider. To do this: Sit with your back straight and supported. Place your feet flat on the floor. Do not cross your legs. Support your arm on a flat surface, such as a table. Make sure your upper arm is at heart level. Each time you measure, take two or three readings one minute apart and record the results. You may also need to have your blood pressure checked regularly by  your health care provider. General information Talk with your health care provider about your diet, exercise habits, and other lifestyle factors that may be contributing to hypertension. Review all the medicines you take with your health care provider because there may be side effects or interactions. Keep all follow-up visits. Your health care provider can help you create and adjust your plan for managing your high blood pressure. Where to find more information National Heart, Lung, and Blood Institute: www.nhlbi.nih.gov American Heart Association: www.heart.org Contact a health care provider if: You think you are having a reaction to medicines you have taken. You have repeated (recurrent) headaches. You feel dizzy. You have swelling in your ankles. You have trouble with your vision. Get help right away if: You develop a severe headache or confusion. You have unusual weakness or numbness, or you feel faint. You have severe pain in your chest or abdomen. You vomit repeatedly. You have trouble breathing. These symptoms may be an emergency. Get help right away. Call 911. Do not wait to see if the symptoms will go away. Do not drive yourself to the hospital. Summary Hypertension is when the force of blood pumping through your arteries is too strong. If this condition is not controlled, it may put you at risk for serious complications. Your personal target blood pressure may vary depending on your medical conditions,   your age, and other factors. For most people, a normal blood pressure is less than 120/80. Hypertension is managed by lifestyle changes, medicines, or both. Lifestyle changes to help manage hypertension include losing weight, eating a healthy, low-sodium diet, exercising more, stopping smoking, and limiting alcohol. This information is not intended to replace advice given to you by your health care provider. Make sure you discuss any questions you have with your health care  provider. Document Revised: 03/16/2021 Document Reviewed: 03/16/2021 Elsevier Patient Education  2023 Elsevier Inc.  

## 2022-09-24 NOTE — Progress Notes (Signed)
Subjective:  Patient ID: Joseph Mckee, male    DOB: 1966/03/03  Age: 57 y.o. MRN: [id number]  CC: Diabetes   HPI Joseph Mckee is a 57 y.o. year old male with a history of type 2 diabetes mellitus (A1c 6.6.), diabetic neuropathy, hypertension, hyperlipidemia, mood disorder, previous tobacco abuse (about 15 pack year)   Interval History:  2 weeks ago he started noticing numbness in the medial aspect of both hands down both hands present all the time  but now it is just limited to his fifth finger.  He is currently on gabapentin 100 mg nightly for neuropathy.  He has lost 45 pounds in the last 8 months by means of substituting his next with fruits and vegetables.  A1c 6.6 down from 7.0 previously and he has been on metformin as well as Ozempic.  He is not up-to-date on annual eye exams we will go ahead and refer him to [facility] care previously.  He does not have hypoglycemic episodes. His BP is elevated today and has also been elevated at his Psychiatry visit and he endorses adherence with his antihypertensives. Past Medical History:  Diagnosis Date   Depression    History of acute renal failure    SECONDARY "CRACK " COCAINE   History of cocaine abuse ([place])    PT STATES ,01-20-2014, HAS BEEN CLEAN SINCE 2008 BUT HAD RELAPSE IN 2012.  [place] .   History of pulmonary edema    SECONDARY TO "CRACK COCAINE"   Hyperlipidemia    Hypertension    Mood disorder ([place])    Phimosis     Past Surgical History:  Procedure Laterality Date   CIRCUMCISION N/A 01/22/2014   Procedure: CIRCUMCISION ADULT;  Surgeon: Arvil Persons, MD;  Location: [facility];  Service: Urology;  Laterality: N/A;   DENTAL SURGERY      History reviewed. No pertinent family history.  Social History   Socioeconomic History   Marital status: Single    Spouse name: Not on file   Number of children: Not on file   Years of education: Not on file   Highest education level:  Not on file  Occupational History   Not on file  Tobacco Use   Smoking status: Former    Packs/day: 0.50    Years: 10.00    Total pack years: 5.00    Types: Cigarettes   Smokeless tobacco: Never  Vaping Use   Vaping Use: Every day  Substance and Sexual Activity   Alcohol use: No    Comment: HX ABUSE   Drug use: No    Comment: HX  "CRACK" COCAINE ABUSE--  PT STATES CLEAN SINCE 2008 BUT HAD RELAPSE IN 2012 ([place])   Sexual activity: Not on file  Other Topics Concern   Not on file  Social History Narrative   Not on file   Social Determinants of Health   Financial Resource Strain: Not on file  Food Insecurity: Not on file  Transportation Needs: Not on file  Physical Activity: Not on file  Stress: Not on file  Social Connections: Not on file    No Known Allergies  Outpatient Medications Prior to Visit  Medication Sig Dispense Refill   Accu-Chek Softclix Lancets lancets USE TO check blood sugar three times daily AS DIRECTED 100 each 2   Blood Glucose Monitoring Suppl (ACCU-CHEK GUIDE) w/Device KIT Check blood sugar 3 times daily. 1 kit 0   divalproex (DEPAKOTE  ER) 500 MG 24 hr tablet Take 500 mg by mouth every morning.     glucose blood (ACCU-CHEK GUIDE) test strip Use as instructed 100 each 12   Semaglutide,0.25 or 0.'5MG'$ /DOS, (OZEMPIC, 0.25 OR 0.5 MG/DOSE,) 2 MG/1.5ML SOPN Inject 0.25 mg into the skin once a week. For 4 weeks, then increase to 0.5 mg once a week thereafter 2 mL 0   atorvastatin (LIPITOR) 20 MG tablet Take 1 tablet (20 mg total) by mouth every morning. 90 tablet 0   gabapentin (NEURONTIN) 100 MG capsule TAKE 1 TO 2 CAPSULES BY MOUTH AT BEDTIME 180 capsule 0   metFORMIN (GLUCOPHAGE) 500 MG tablet Take 1 tablet (500 mg total) by mouth 2 (two) times daily with a meal. 180 tablet 1   olmesartan-hydrochlorothiazide (BENICAR HCT) 20-12.5 MG tablet Take 1 tablet by mouth every morning. 90 tablet 1   Semaglutide,0.25 or 0.'5MG'$ /DOS,  (OZEMPIC, 0.25 OR 0.5 MG/DOSE,) 2 MG/1.5ML SOPN Inject 0.5 mg into the skin once a week. After completion of 0.25 mg. 2 mL 6   No facility-administered medications prior to visit.     ROS Review of Systems  Constitutional:  Negative for activity change and appetite change.  HENT:  Negative for sinus pressure and sore throat.   Respiratory:  Negative for chest tightness, shortness of breath and wheezing.   Cardiovascular:  Negative for chest pain and palpitations.  Gastrointestinal:  Negative for abdominal distention, abdominal pain and constipation.  Genitourinary: Negative.   Musculoskeletal: Negative.   Neurological:  Positive for numbness.  Psychiatric/Behavioral:  Negative for behavioral problems and dysphoric mood.     Objective:  BP (!) 163/95   Pulse 84   Temp 98.7 F (37.1 C) (Oral)   Ht '5\' 10"'$  (1.778 m)   Wt 256 lb 3.2 oz (116.2 kg)   SpO2 99%   BMI 36.76 kg/m      09/24/2022    9:38 AM 09/24/2022    9:04 AM 02/01/2022    8:37 AM  BP/Weight  Systolic BP [id number]  Diastolic BP 95 93 75  Wt. (Lbs)  256.2 291.4  BMI  36.76 kg/m2 41.81 kg/m2    Wt Readings from Last 3 Encounters:  09/24/22 256 lb 3.2 oz (116.2 kg)  02/01/22 291 lb 6.4 oz (132.2 kg)  08/03/21 278 lb (126.1 kg)     Physical Exam Constitutional:      Appearance: He is well-developed.  Cardiovascular:     Rate and Rhythm: Normal rate.     Heart sounds: Normal heart sounds. No murmur heard. Pulmonary:     Effort: Pulmonary effort is normal.     Breath sounds: Normal breath sounds. No wheezing or rales.  Chest:     Chest wall: No tenderness.  Abdominal:     General: Bowel sounds are normal. There is no distension.     Palpations: Abdomen is soft. There is no mass.     Tenderness: There is no abdominal tenderness.  Musculoskeletal:        General: Normal range of motion.     Right lower leg: No edema.     Left lower leg: No edema.  Neurological:     Mental Status: He is alert and  oriented to person, place, and time.  Psychiatric:        Mood and Affect: Mood normal.        Latest Ref Rng & Units 02/01/2022    9:55 AM 05/03/2021   10:24 AM 01/22/2014  10:49 AM  CMP  Glucose 70 - 99 mg/dL 108  108  88   BUN 6 - 24 mg/dL 17  11    Creatinine 0.76 - 1.27 mg/dL 1.31  1.15    Sodium 134 - 144 mmol/L 138  140  140   Potassium 3.5 - 5.2 mmol/L 4.7  5.0  3.8   Chloride 96 - 106 mmol/L 100  103    CO2 20 - 29 mmol/L 22  24    Calcium 8.7 - 10.2 mg/dL 9.6  9.9    Total Protein 6.0 - 8.5 g/dL 7.1  7.4    Total Bilirubin 0.0 - 1.2 mg/dL 0.3  0.4    Alkaline Phos 44 - 121 IU/L 105  121    AST 0 - 40 IU/L 25  24    ALT 0 - 44 IU/L 26  35      Lipid Panel     Component Value Date/Time   CHOL 164 02/01/2022 0955   TRIG 125 02/01/2022 0955   HDL 38 (L) 02/01/2022 0955   LDLCALC 103 (H) 02/01/2022 0955    CBC    Component Value Date/Time   HGB 13.6 01/22/2014 1049   HCT 40.0 01/22/2014 1049    Lab Results  Component Value Date   HGBA1C 6.6 09/24/2022    Assessment & Plan:  1. Type 2 diabetes mellitus with diabetic polyneuropathy, with long-term current use of insulin (HCC) Controlled with A1c of 6.6 down from 7.0 Commended on improvement Continue with current regimen Counseled on Diabetic diet, my plate method, [id number] minutes of moderate intensity exercise/week Blood sugar logs with fasting goals of 80-120 mg/dl, random of less than 180 and in the event of sugars less than 60 mg/dl or greater than 400 mg/dl encouraged to notify the clinic. Advised on the need for annual eye exams, annual foot exams, Pneumonia vaccine. - POCT glucose (manual entry) - POCT glycosylated hemoglobin (Hb A1C) - Microalbumin / creatinine urine ratio - LP+Non-HDL Cholesterol - CMP14+EGFR - gabapentin (NEURONTIN) 100 MG capsule; Take 2 capsules (200 mg total) by mouth at bedtime.  Dispense: 180 capsule; Refill: 1 - metFORMIN (GLUCOPHAGE) 500 MG tablet; Take 1 tablet (500 mg  total) by mouth 2 (two) times daily with a meal.  Dispense: 180 tablet; Refill: 1 - Semaglutide,0.25 or 0.'5MG'$ /DOS, (OZEMPIC, 0.25 OR 0.5 MG/DOSE,) 2 MG/1.5ML SOPN; Inject 0.5 mg into the skin once a week.  Dispense: 2 mL; Refill: 6  2. Screening for colon cancer - Cologuard  3. Neuropathy He is currently on metformin hence I will check for vitamin B12 deficiency Increased gabapentin from 100 mg nightly to 200 mg nightly - Vitamin B12  4. Hypertension associated with diabetes ([place]) Uncontrolled Increased dose of Benicar Counseled on blood pressure goal of less than 130/80, low-sodium, DASH diet, medication compliance, 150 minutes of moderate intensity exercise per week. Discussed medication compliance, adverse effects. - olmesartan-hydrochlorothiazide (BENICAR HCT) 40-25 MG tablet; Take 1 tablet by mouth daily.  Dispense: 90 tablet; Refill: 1  5. Hyperlipidemia associated with type 2 diabetes mellitus (HCC) Controlled Low-cholesterol diet - atorvastatin (LIPITOR) 20 MG tablet; Take 1 tablet (20 mg total) by mouth every morning.  Dispense: 90 tablet; Refill: 1    Meds ordered this encounter  Medications   olmesartan-hydrochlorothiazide (BENICAR HCT) 40-25 MG tablet    Sig: Take 1 tablet by mouth daily.    Dispense:  90 tablet    Refill:  1    Discontinue 20/12.5  atorvastatin (LIPITOR) 20 MG tablet    Sig: Take 1 tablet (20 mg total) by mouth every morning.    Dispense:  90 tablet    Refill:  1   gabapentin (NEURONTIN) 100 MG capsule    Sig: Take 2 capsules (200 mg total) by mouth at bedtime.    Dispense:  180 capsule    Refill:  1   metFORMIN (GLUCOPHAGE) 500 MG tablet    Sig: Take 1 tablet (500 mg total) by mouth 2 (two) times daily with a meal.    Dispense:  180 tablet    Refill:  1   Semaglutide,0.25 or 0.'5MG'$ /DOS, (OZEMPIC, 0.25 OR 0.5 MG/DOSE,) 2 MG/1.5ML SOPN    Sig: Inject 0.5 mg into the skin once a week.    Dispense:  2 mL    Refill:  6    Follow-up: Return  in about 1 month (around 10/25/2022) for Blood Pressure follow-up, Chronic medical conditions in 6 months with PCP.       Charlott Rakes, MD, FAAFP. [facility] and [facility], [place]   09/24/2022, 10:58 AM

## 2022-09-25 LAB — CMP14+EGFR
ALT: 22 IU/L (ref 0–44)
AST: 19 IU/L (ref 0–40)
Albumin/Globulin Ratio: 1.6 (ref 1.2–2.2)
Albumin: 4.4 g/dL (ref 3.8–4.9)
Alkaline Phosphatase: 126 IU/L — ABNORMAL HIGH (ref 44–121)
BUN/Creatinine Ratio: 10 (ref 9–20)
BUN: 10 mg/dL (ref 6–24)
Bilirubin Total: 0.3 mg/dL (ref 0.0–1.2)
CO2: 21 mmol/L (ref 20–29)
Calcium: 9.9 mg/dL (ref 8.7–10.2)
Chloride: 101 mmol/L (ref 96–106)
Creatinine, Ser: 0.99 mg/dL (ref 0.76–1.27)
Globulin, Total: 2.7 g/dL (ref 1.5–4.5)
Glucose: 107 mg/dL — ABNORMAL HIGH (ref 70–99)
Potassium: 4.3 mmol/L (ref 3.5–5.2)
Sodium: 138 mmol/L (ref 134–144)
Total Protein: 7.1 g/dL (ref 6.0–8.5)
eGFR: 89 mL/min/{1.73_m2} (ref >59)

## 2022-09-25 LAB — LP+NON-HDL CHOLESTEROL
Cholesterol, Total: 244 mg/dL — ABNORMAL HIGH (ref 100–199)
HDL: 54 mg/dL (ref >39)
LDL Chol Calc (NIH): 171 mg/dL — ABNORMAL HIGH (ref 0–99)
Total Non-HDL-Chol (LDL+VLDL): 190 mg/dL — ABNORMAL HIGH (ref 0–129)
Triglycerides: 108 mg/dL (ref 0–149)
VLDL Cholesterol Cal: 19 mg/dL (ref 5–40)

## 2022-09-25 LAB — MICROALBUMIN / CREATININE URINE RATIO
Creatinine, Urine: 35.5 mg/dL
Microalb/Creat Ratio: <8 mg/g creat (ref 0–29)
Microalbumin, Urine: <3 ug/mL

## 2022-09-25 LAB — VITAMIN B12: Vitamin B-12: 448 pg/mL (ref 232–1245)

## 2022-09-27 ENCOUNTER — Other Ambulatory Visit: Payer: Self-pay | Admitting: Family Medicine

## 2022-09-27 DIAGNOSIS — E1169 Type 2 diabetes mellitus with other specified complication: Secondary | ICD-10-CM

## 2022-09-27 MED ORDER — ATORVASTATIN CALCIUM 40 MG PO TABS: 40.0000 mg | ORAL_TABLET | Freq: Every morning | ORAL | 1 refills | Status: DC

## 2022-10-29 ENCOUNTER — Emergency Department (HOSPITAL_COMMUNITY): Payer: Medicaid Other

## 2022-10-29 ENCOUNTER — Ambulatory Visit: Payer: Medicaid Other | Admitting: Family Medicine

## 2022-10-29 ENCOUNTER — Inpatient Hospital Stay (HOSPITAL_COMMUNITY)
Admission: EM | Admit: 2022-10-29 | Discharge: 2022-11-01 | DRG: 871 | Disposition: A | Discharge status: Home / Self Care | Payer: Medicaid Other | Attending: Family Medicine | Admitting: Family Medicine

## 2022-10-29 ENCOUNTER — Other Ambulatory Visit: Payer: Self-pay

## 2022-10-29 ENCOUNTER — Inpatient Hospital Stay (HOSPITAL_COMMUNITY): Payer: Medicaid Other

## 2022-10-29 DIAGNOSIS — E876 Hypokalemia: Secondary | ICD-10-CM | POA: Diagnosis present

## 2022-10-29 DIAGNOSIS — G928 Other toxic encephalopathy: Secondary | ICD-10-CM | POA: Diagnosis present

## 2022-10-29 DIAGNOSIS — R220 Localized swelling, mass and lump, head: Secondary | ICD-10-CM | POA: Insufficient documentation

## 2022-10-29 DIAGNOSIS — E1142 Type 2 diabetes mellitus with diabetic polyneuropathy: Secondary | ICD-10-CM | POA: Diagnosis present

## 2022-10-29 DIAGNOSIS — E11649 Type 2 diabetes mellitus with hypoglycemia without coma: Secondary | ICD-10-CM | POA: Diagnosis not present

## 2022-10-29 DIAGNOSIS — Z7984 Long term (current) use of oral hypoglycemic drugs: Secondary | ICD-10-CM

## 2022-10-29 DIAGNOSIS — F1410 Cocaine abuse, uncomplicated: Secondary | ICD-10-CM | POA: Diagnosis present

## 2022-10-29 DIAGNOSIS — E669 Obesity, unspecified: Secondary | ICD-10-CM | POA: Diagnosis present

## 2022-10-29 DIAGNOSIS — F319 Bipolar disorder, unspecified: Secondary | ICD-10-CM | POA: Diagnosis present

## 2022-10-29 DIAGNOSIS — S00531A Contusion of lip, initial encounter: Secondary | ICD-10-CM | POA: Diagnosis present

## 2022-10-29 DIAGNOSIS — R6521 Severe sepsis with septic shock: Secondary | ICD-10-CM | POA: Diagnosis present

## 2022-10-29 DIAGNOSIS — R569 Unspecified convulsions: Secondary | ICD-10-CM | POA: Diagnosis present

## 2022-10-29 DIAGNOSIS — Z6836 Body mass index (BMI) 36.0-36.9, adult: Secondary | ICD-10-CM

## 2022-10-29 DIAGNOSIS — G9340 Encephalopathy, unspecified: Secondary | ICD-10-CM | POA: Diagnosis not present

## 2022-10-29 DIAGNOSIS — E870 Hyperosmolality and hypernatremia: Secondary | ICD-10-CM | POA: Diagnosis present

## 2022-10-29 DIAGNOSIS — E785 Hyperlipidemia, unspecified: Secondary | ICD-10-CM | POA: Diagnosis present

## 2022-10-29 DIAGNOSIS — J811 Chronic pulmonary edema: Secondary | ICD-10-CM | POA: Diagnosis present

## 2022-10-29 DIAGNOSIS — R159 Full incontinence of feces: Secondary | ICD-10-CM | POA: Diagnosis present

## 2022-10-29 DIAGNOSIS — N179 Acute kidney failure, unspecified: Secondary | ICD-10-CM | POA: Diagnosis present

## 2022-10-29 DIAGNOSIS — Y92410 Unspecified street and highway as the place of occurrence of the external cause: Secondary | ICD-10-CM | POA: Diagnosis not present

## 2022-10-29 DIAGNOSIS — I152 Hypertension secondary to endocrine disorders: Secondary | ICD-10-CM | POA: Diagnosis present

## 2022-10-29 DIAGNOSIS — E861 Hypovolemia: Secondary | ICD-10-CM | POA: Diagnosis present

## 2022-10-29 DIAGNOSIS — J189 Pneumonia, unspecified organism: Secondary | ICD-10-CM | POA: Diagnosis present

## 2022-10-29 DIAGNOSIS — A419 Sepsis, unspecified organism: Secondary | ICD-10-CM | POA: Diagnosis present

## 2022-10-29 DIAGNOSIS — Z1152 Encounter for screening for COVID-19: Secondary | ICD-10-CM | POA: Diagnosis not present

## 2022-10-29 DIAGNOSIS — E1169 Type 2 diabetes mellitus with other specified complication: Secondary | ICD-10-CM | POA: Diagnosis present

## 2022-10-29 DIAGNOSIS — Z7985 Long-term (current) use of injectable non-insulin antidiabetic drugs: Secondary | ICD-10-CM

## 2022-10-29 DIAGNOSIS — R32 Unspecified urinary incontinence: Secondary | ICD-10-CM | POA: Diagnosis present

## 2022-10-29 DIAGNOSIS — E1159 Type 2 diabetes mellitus with other circulatory complications: Secondary | ICD-10-CM | POA: Diagnosis present

## 2022-10-29 DIAGNOSIS — Z79899 Other long term (current) drug therapy: Secondary | ICD-10-CM

## 2022-10-29 DIAGNOSIS — J9811 Atelectasis: Secondary | ICD-10-CM | POA: Diagnosis present

## 2022-10-29 DIAGNOSIS — R578 Other shock: Secondary | ICD-10-CM | POA: Diagnosis not present

## 2022-10-29 DIAGNOSIS — R4182 Altered mental status, unspecified: Secondary | ICD-10-CM | POA: Diagnosis present

## 2022-10-29 DIAGNOSIS — J9601 Acute respiratory failure with hypoxia: Secondary | ICD-10-CM | POA: Diagnosis present

## 2022-10-29 DIAGNOSIS — Z794 Long term (current) use of insulin: Secondary | ICD-10-CM

## 2022-10-29 DIAGNOSIS — F1910 Other psychoactive substance abuse, uncomplicated: Secondary | ICD-10-CM | POA: Diagnosis not present

## 2022-10-29 DIAGNOSIS — F32A Depression, unspecified: Secondary | ICD-10-CM | POA: Diagnosis present

## 2022-10-29 DIAGNOSIS — Z781 Physical restraint status: Secondary | ICD-10-CM

## 2022-10-29 DIAGNOSIS — Z87891 Personal history of nicotine dependence: Secondary | ICD-10-CM | POA: Diagnosis not present

## 2022-10-29 LAB — URINALYSIS, ROUTINE W REFLEX MICROSCOPIC
Bilirubin Urine: NEGATIVE
Glucose, UA: NEGATIVE mg/dL
Ketones, ur: NEGATIVE mg/dL
Leukocytes,Ua: NEGATIVE
Nitrite: NEGATIVE
Protein, ur: >=300 mg/dL — AB
Specific Gravity, Urine: 1.027 (ref 1.005–1.030)
pH: 5 (ref 5.0–8.0)

## 2022-10-29 LAB — CULTURE, BLOOD (ROUTINE X 2)

## 2022-10-29 LAB — I-STAT ARTERIAL BLOOD GAS, ED
Acid-base deficit: 4 mmol/L — ABNORMAL HIGH (ref 0.0–2.0)
Bicarbonate: 23.3 mmol/L (ref 20.0–28.0)
Calcium, Ion: 1.37 mmol/L (ref 1.15–1.40)
HCT: 39 % (ref 39.0–52.0)
Hemoglobin: 13.3 g/dL (ref 13.0–17.0)
O2 Saturation: 100 %
Patient temperature: 102.9
Potassium: 4.2 mmol/L (ref 3.5–5.1)
Sodium: 148 mmol/L — ABNORMAL HIGH (ref 135–145)
TCO2: 25 mmol/L (ref 22–32)
pCO2 arterial: 59.2 mmHg — ABNORMAL HIGH (ref 32–48)
pH, Arterial: 7.216 — ABNORMAL LOW (ref 7.35–7.45)
pO2, Arterial: 356 mmHg — ABNORMAL HIGH (ref 83–108)

## 2022-10-29 LAB — CBC WITH DIFFERENTIAL/PLATELET
Abs Immature Granulocytes: 0.3 10*3/uL — ABNORMAL HIGH (ref 0.00–0.07)
Basophils Absolute: 0.1 10*3/uL (ref 0.0–0.1)
Basophils Relative: 1 %
Eosinophils Absolute: 0 10*3/uL (ref 0.0–0.5)
Eosinophils Relative: 0 %
HCT: 45.4 % (ref 39.0–52.0)
Hemoglobin: 14.6 g/dL (ref 13.0–17.0)
Immature Granulocytes: 4 %
Lymphocytes Relative: 19 %
Lymphs Abs: 1.4 10*3/uL (ref 0.7–4.0)
MCH: 31.6 pg (ref 26.0–34.0)
MCHC: 32.2 g/dL (ref 30.0–36.0)
MCV: 98.3 fL (ref 80.0–100.0)
Monocytes Absolute: 0.5 10*3/uL (ref 0.1–1.0)
Monocytes Relative: 7 %
Neutro Abs: 4.9 10*3/uL (ref 1.7–7.7)
Neutrophils Relative %: 69 %
Platelets: 359 10*3/uL (ref 150–400)
RBC: 4.62 MIL/uL (ref 4.22–5.81)
RDW: 17.1 % — ABNORMAL HIGH (ref 11.5–15.5)
WBC: 7.1 10*3/uL (ref 4.0–10.5)
nRBC: 0.3 % — ABNORMAL HIGH (ref 0.0–0.2)

## 2022-10-29 LAB — COMPREHENSIVE METABOLIC PANEL
ALT: 29 U/L (ref 0–44)
AST: 40 U/L (ref 15–41)
Albumin: 4 g/dL (ref 3.5–5.0)
Alkaline Phosphatase: 82 U/L (ref 38–126)
Anion gap: 17 — ABNORMAL HIGH (ref 5–15)
BUN: 22 mg/dL — ABNORMAL HIGH (ref 6–20)
CO2: 20 mmol/L — ABNORMAL LOW (ref 22–32)
Calcium: 10.5 mg/dL — ABNORMAL HIGH (ref 8.9–10.3)
Chloride: 113 mmol/L — ABNORMAL HIGH (ref 98–111)
Creatinine, Ser: 2.78 mg/dL — ABNORMAL HIGH (ref 0.61–1.24)
GFR, Estimated: 26 mL/min — ABNORMAL LOW (ref >60)
Glucose, Bld: 161 mg/dL — ABNORMAL HIGH (ref 70–99)
Potassium: 4.5 mmol/L (ref 3.5–5.1)
Sodium: 150 mmol/L — ABNORMAL HIGH (ref 135–145)
Total Bilirubin: 1 mg/dL (ref 0.3–1.2)
Total Protein: 8.1 g/dL (ref 6.5–8.1)

## 2022-10-29 LAB — PROTEIN AND GLUCOSE, CSF
Glucose, CSF: 112 mg/dL — ABNORMAL HIGH (ref 40–70)
Total  Protein, CSF: 34 mg/dL (ref 15–45)

## 2022-10-29 LAB — PROTIME-INR
INR: 1.2 (ref 0.8–1.2)
Prothrombin Time: 15.1 seconds (ref 11.4–15.2)

## 2022-10-29 LAB — LACTIC ACID, PLASMA
Lactic Acid, Venous: 4.3 mmol/L (ref 0.5–1.9)
Lactic Acid, Venous: 2 mmol/L (ref 0.5–1.9)

## 2022-10-29 LAB — APTT: aPTT: 29 seconds (ref 24–36)

## 2022-10-29 LAB — CSF CULTURE W GRAM STAIN

## 2022-10-29 LAB — RESP PANEL BY RT-PCR (RSV, FLU A&B, COVID)  RVPGX2
Influenza A by PCR: NEGATIVE
Influenza B by PCR: NEGATIVE
Resp Syncytial Virus by PCR: NEGATIVE
SARS Coronavirus 2 by RT PCR: NEGATIVE

## 2022-10-29 MED ORDER — IOHEXOL 350 MG/ML SOLN
75.0000 mL | Freq: Once | INTRAVENOUS | Status: AC | PRN
  Administered 2022-10-29: 75 mL via INTRAVENOUS

## 2022-10-29 MED ORDER — FENTANYL CITRATE PF 50 MCG/ML IJ SOSY
50.0000 ug | PREFILLED_SYRINGE | INTRAMUSCULAR | Status: DC | PRN
  Administered 2022-10-29: 100 ug via INTRAVENOUS
  Filled 2022-10-29: qty 2

## 2022-10-29 MED ORDER — ACETAMINOPHEN 160 MG/5ML PO SOLN
650.0000 mg | Freq: Once | ORAL | Status: AC
  Administered 2022-10-29: 650 mg
  Filled 2022-10-29: qty 20.3

## 2022-10-29 MED ORDER — SODIUM CHLORIDE 0.9 % IV SOLN: 3000.0000 mg | INTRAVENOUS | Status: DC

## 2022-10-29 MED ORDER — PHENYLEPHRINE HCL (PRESSORS) 10 MG/ML IV SOLN: INTRAVENOUS | Status: DC | PRN

## 2022-10-29 MED ORDER — LACTATED RINGERS IV BOLUS (SEPSIS)
1000.0000 mL | Freq: Once | INTRAVENOUS | Status: AC
  Administered 2022-10-29: 1000 mL via INTRAVENOUS

## 2022-10-29 MED ORDER — DOCUSATE SODIUM 50 MG/5ML PO LIQD
100.0000 mg | Freq: Two times a day (BID) | ORAL | Status: DC
  Administered 2022-10-30: 100 mg
  Filled 2022-10-29: qty 10

## 2022-10-29 MED ORDER — SODIUM CHLORIDE 0.9 % IV SOLN: 2.0000 g | Freq: Two times a day (BID) | INTRAVENOUS | Status: DC

## 2022-10-29 MED ORDER — SODIUM CHLORIDE 0.9 % IV SOLN
250.0000 mL | INTRAVENOUS | Status: DC
  Administered 2022-10-30: 250 mL via INTRAVENOUS

## 2022-10-29 MED ORDER — ONDANSETRON HCL 4 MG/2ML IJ SOLN: 4.0000 mg | Freq: Four times a day (QID) | INTRAMUSCULAR | Status: DC | PRN

## 2022-10-29 MED ORDER — DOCUSATE SODIUM 50 MG/5ML PO LIQD: 100.0000 mg | Freq: Two times a day (BID) | ORAL | Status: DC | PRN

## 2022-10-29 MED ORDER — SODIUM CHLORIDE 0.9 % IV SOLN
2.0000 g | INTRAVENOUS | Status: DC
  Administered 2022-10-29: 2 g via INTRAVENOUS
  Filled 2022-10-29: qty 20

## 2022-10-29 MED ORDER — PROPOFOL 10 MG/ML IV BOLUS: 10.0000 mg | Freq: Once | INTRAVENOUS | Status: DC

## 2022-10-29 MED ORDER — SODIUM CHLORIDE 0.9 % IV SOLN
INTRAVENOUS | Status: DC | PRN
  Administered 2022-10-29: 1000 mL via INTRAVENOUS

## 2022-10-29 MED ORDER — PROPOFOL 1000 MG/100ML IV EMUL
0.0000 ug/kg/min | INTRAVENOUS | Status: DC
  Administered 2022-10-29: 5 ug/kg/min via INTRAVENOUS
  Administered 2022-10-30 (×2): 30 ug/kg/min via INTRAVENOUS
  Filled 2022-10-29 (×2): qty 100

## 2022-10-29 MED ORDER — IPRATROPIUM-ALBUTEROL 0.5-2.5 (3) MG/3ML IN SOLN
3.0000 mL | Freq: Four times a day (QID) | RESPIRATORY_TRACT | Status: DC
  Administered 2022-10-30 (×2): 3 mL via RESPIRATORY_TRACT
  Filled 2022-10-29 (×2): qty 3

## 2022-10-29 MED ORDER — HEPARIN SODIUM (PORCINE) 5000 UNIT/ML IJ SOLN
5000.0000 [IU] | Freq: Three times a day (TID) | INTRAMUSCULAR | Status: DC
  Administered 2022-10-30 – 2022-11-01 (×8): 5000 [IU] via SUBCUTANEOUS
  Filled 2022-10-29 (×8): qty 1

## 2022-10-29 MED ORDER — FENTANYL CITRATE PF 50 MCG/ML IJ SOSY
50.0000 ug | PREFILLED_SYRINGE | INTRAMUSCULAR | Status: DC | PRN
  Administered 2022-10-29 (×2): 50 ug via INTRAVENOUS
  Filled 2022-10-29: qty 1

## 2022-10-29 MED ORDER — POLYETHYLENE GLYCOL 3350 17 G PO PACK: 17.0000 g | PACK | Freq: Every day | ORAL | Status: DC

## 2022-10-29 MED ORDER — FAMOTIDINE 20 MG PO TABS
20.0000 mg | ORAL_TABLET | Freq: Two times a day (BID) | ORAL | Status: DC
  Administered 2022-10-30: 20 mg
  Filled 2022-10-29 (×2): qty 1

## 2022-10-29 MED ORDER — FENTANYL 2500MCG IN NS 250ML (10MCG/ML) PREMIX INFUSION
50.0000 ug/h | INTRAVENOUS | Status: DC
  Administered 2022-10-30: 50 ug/h via INTRAVENOUS
  Filled 2022-10-29: qty 250

## 2022-10-29 MED ORDER — LACTATED RINGERS IV SOLN: INTRAVENOUS | Status: DC

## 2022-10-29 MED ORDER — VANCOMYCIN HCL 2000 MG/400ML IV SOLN
2000.0000 mg | Freq: Once | INTRAVENOUS | Status: AC
  Administered 2022-10-29: 2000 mg via INTRAVENOUS
  Filled 2022-10-29: qty 400

## 2022-10-29 MED ORDER — ETOMIDATE 2 MG/ML IV SOLN
INTRAVENOUS | Status: DC | PRN
  Administered 2022-10-29: 30 mg via INTRAVENOUS

## 2022-10-29 MED ORDER — INSULIN ASPART 100 UNIT/ML IJ SOLN
0.0000 [IU] | INTRAMUSCULAR | Status: DC
  Administered 2022-10-30: 1 [IU] via SUBCUTANEOUS

## 2022-10-29 MED ORDER — MIDAZOLAM HCL 2 MG/2ML IJ SOLN: 1.0000 mg | INTRAMUSCULAR | Status: DC | PRN

## 2022-10-29 MED ORDER — LACTATED RINGERS IV BOLUS
1000.0000 mL | Freq: Once | INTRAVENOUS | Status: AC
  Administered 2022-10-30: 1000 mL via INTRAVENOUS

## 2022-10-29 MED ORDER — PHENYLEPHRINE 80 MCG/ML (10ML) SYRINGE FOR IV PUSH (FOR BLOOD PRESSURE SUPPORT)
160.0000 ug | PREFILLED_SYRINGE | Freq: Once | INTRAVENOUS | Status: AC
  Administered 2022-10-29: 160 ug via INTRAVENOUS

## 2022-10-29 MED ORDER — FAMOTIDINE 20 MG PO TABS: 20.0000 mg | ORAL_TABLET | Freq: Two times a day (BID) | ORAL | Status: DC

## 2022-10-29 MED ORDER — SUCCINYLCHOLINE CHLORIDE 200 MG/10ML IV SOSY
100.0000 mg | PREFILLED_SYRINGE | Freq: Once | INTRAVENOUS | Status: AC | PRN
  Administered 2022-10-29: 100 mg via INTRAVENOUS
  Filled 2022-10-29: qty 10

## 2022-10-29 MED ORDER — LEVETIRACETAM IN NACL 1000 MG/100ML IV SOLN
1000.0000 mg | INTRAVENOUS | Status: AC
  Administered 2022-10-29: 1000 mg via INTRAVENOUS
  Filled 2022-10-29: qty 100

## 2022-10-29 MED ORDER — POLYETHYLENE GLYCOL 3350 17 G PO PACK: 17.0000 g | PACK | Freq: Every day | ORAL | Status: DC | PRN

## 2022-10-29 MED ORDER — NOREPINEPHRINE 4 MG/250ML-% IV SOLN
2.0000 ug/min | INTRAVENOUS | Status: DC
  Administered 2022-10-29: 5 ug/min via INTRAVENOUS
  Administered 2022-10-30: 4 ug/min via INTRAVENOUS
  Filled 2022-10-29: qty 250

## 2022-10-29 MED ORDER — FENTANYL CITRATE PF 50 MCG/ML IJ SOSY
PREFILLED_SYRINGE | INTRAMUSCULAR | Status: AC
  Administered 2022-10-29: 50 ug via INTRAVENOUS
  Filled 2022-10-29: qty 1

## 2022-10-29 MED ORDER — IPRATROPIUM-ALBUTEROL 0.5-2.5 (3) MG/3ML IN SOLN: 3.0000 mL | Freq: Four times a day (QID) | RESPIRATORY_TRACT | Status: DC | PRN

## 2022-10-29 MED ORDER — ROCURONIUM BROMIDE 50 MG/5ML IV SOLN
INTRAVENOUS | Status: DC | PRN
  Administered 2022-10-29: 100 mg via INTRAVENOUS

## 2022-10-29 MED ORDER — VANCOMYCIN VARIABLE DOSE PER UNSTABLE RENAL FUNCTION (PHARMACIST DOSING): Status: DC

## 2022-10-29 NOTE — Progress Notes (Signed)
Pharmacy Antibiotic Note  Joseph Mckee is a 57 y.o. male for which pharmacy has been consulted for vancomycin dosing for sepsis. Patient with seizures and fever on arrival. Intubated in ED.  SCr 2.78 - AKI WBC 7.1; LA 4.3; T 102.9; HR 127; RR 19  Plan: Ceftriaxone per MD Vancomycin 2000 mg once, subsequent dosing as indicated per random vancomycin level until renal function stable and/or improved, at which time scheduled dosing can be considered Trend WBC, Fever, Renal function F/u cultures, clinical course, WBC De-escalate when able     Temp (24hrs), Avg:102.9 F (39.4 C), Min:102.9 F (39.4 C), Max:102.9 F (39.4 C)  Recent Labs  Lab 10/29/22 1904  WBC 7.1  CREATININE 2.78*  LATICACIDVEN 4.3*    CrCl cannot be calculated (Unknown ideal weight.).    No Known Allergies  Microbiology results: Pending  Thank you for allowing pharmacy to be a part of this patient's care.  Delmar Landau, PharmD, BCPS 10/29/2022 8:27 PM ED Clinical Pharmacist -  [phone]

## 2022-10-29 NOTE — Progress Notes (Signed)
IV team arrived for USGPIV.  Patient not in room.

## 2022-10-29 NOTE — Sepsis Progress Note (Signed)
Elink monitoring for the code sepsis protocol.  
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
## 2022-10-29 NOTE — Progress Notes (Signed)
Per, Wilford Corner, MD patient has a scheduled spinal tap. Techs with place EEG when patient comes available.
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
## 2022-10-29 NOTE — Progress Notes (Signed)
An USGPIV (ultrasound guided PIV) has been placed for short-term vasopressor infusion. A correctly placed ivWatch must be used when administering Vasopressors. Should this treatment be needed beyond 72 hours, central line access should be obtained.  It will be the responsibility of the bedside nurse to follow best practice to prevent extravasations. [facility] RN aware, no IV watch in the emergency room.

## 2022-10-29 NOTE — ED Notes (Signed)
LP completed by Rhunette Croft MD and Warden Fillers MD. RN walked specimens to lab and handed specimens to lab staff.

## 2022-10-29 NOTE — ED Notes (Signed)
Pt transported to CT on monitor with RN and RT.

## 2022-10-30 ENCOUNTER — Encounter (HOSPITAL_COMMUNITY): Payer: Self-pay | Admitting: Pulmonary Disease

## 2022-10-30 ENCOUNTER — Inpatient Hospital Stay (HOSPITAL_COMMUNITY): Payer: Medicaid Other

## 2022-10-30 DIAGNOSIS — R569 Unspecified convulsions: Secondary | ICD-10-CM

## 2022-10-30 DIAGNOSIS — G9340 Encephalopathy, unspecified: Secondary | ICD-10-CM

## 2022-10-30 DIAGNOSIS — R578 Other shock: Secondary | ICD-10-CM | POA: Diagnosis not present

## 2022-10-30 DIAGNOSIS — F1910 Other psychoactive substance abuse, uncomplicated: Secondary | ICD-10-CM | POA: Insufficient documentation

## 2022-10-30 DIAGNOSIS — N179 Acute kidney failure, unspecified: Secondary | ICD-10-CM | POA: Diagnosis not present

## 2022-10-30 DIAGNOSIS — J9601 Acute respiratory failure with hypoxia: Secondary | ICD-10-CM | POA: Diagnosis not present

## 2022-10-30 LAB — CSF CELL COUNT WITH DIFFERENTIAL
RBC Count, CSF: 248 /mm3 — ABNORMAL HIGH
Tube #: 4
WBC, CSF: 2 /mm3 (ref 0–5)

## 2022-10-30 LAB — POCT I-STAT 7, (LYTES, BLD GAS, ICA,H+H)
Acid-base deficit: 4 mmol/L — ABNORMAL HIGH (ref 0.0–2.0)
Bicarbonate: 21.3 mmol/L (ref 20.0–28.0)
Calcium, Ion: 1.34 mmol/L (ref 1.15–1.40)
HCT: 33 % — ABNORMAL LOW (ref 39.0–52.0)
Hemoglobin: 11.2 g/dL — ABNORMAL LOW (ref 13.0–17.0)
O2 Saturation: 99 %
Patient temperature: 98.8
Potassium: 3.4 mmol/L — ABNORMAL LOW (ref 3.5–5.1)
Sodium: 146 mmol/L — ABNORMAL HIGH (ref 135–145)
TCO2: 22 mmol/L (ref 22–32)
pCO2 arterial: 36.7 mmHg (ref 32–48)
pH, Arterial: 7.371 (ref 7.35–7.45)
pO2, Arterial: 160 mmHg — ABNORMAL HIGH (ref 83–108)

## 2022-10-30 LAB — RAPID URINE DRUG SCREEN, HOSP PERFORMED
Amphetamines: NOT DETECTED
Barbiturates: NOT DETECTED
Benzodiazepines: POSITIVE — AB
Cocaine: POSITIVE — AB
Opiates: NOT DETECTED
Tetrahydrocannabinol: NOT DETECTED

## 2022-10-30 LAB — URINE CULTURE: Culture: NO GROWTH

## 2022-10-30 LAB — CULTURE, BLOOD (ROUTINE X 2)

## 2022-10-30 LAB — MENINGITIS/ENCEPHALITIS PANEL (CSF)

## 2022-10-30 LAB — CBC
HCT: 34 % — ABNORMAL LOW (ref 39.0–52.0)
Hemoglobin: 11.1 g/dL — ABNORMAL LOW (ref 13.0–17.0)
MCH: 31.7 pg (ref 26.0–34.0)
MCHC: 32.6 g/dL (ref 30.0–36.0)
MCV: 97.1 fL (ref 80.0–100.0)
Platelets: 146 10*3/uL — ABNORMAL LOW (ref 150–400)
RBC: 3.5 MIL/uL — ABNORMAL LOW (ref 4.22–5.81)
RDW: 18 % — ABNORMAL HIGH (ref 11.5–15.5)
WBC: 9.8 10*3/uL (ref 4.0–10.5)
nRBC: 0.5 % — ABNORMAL HIGH (ref 0.0–0.2)

## 2022-10-30 LAB — ECHOCARDIOGRAM COMPLETE
AR max vel: 2.72 cm2
AV Peak grad: 9.5 mmHg
Ao pk vel: 1.54 m/s
Area-P 1/2: 4.89 cm2
S' Lateral: 2.9 cm
Weight: 4172.87 oz

## 2022-10-30 LAB — BASIC METABOLIC PANEL
Anion gap: 10 (ref 5–15)
BUN: 21 mg/dL — ABNORMAL HIGH (ref 6–20)
CO2: 19 mmol/L — ABNORMAL LOW (ref 22–32)
Calcium: 8.3 mg/dL — ABNORMAL LOW (ref 8.9–10.3)
Chloride: 114 mmol/L — ABNORMAL HIGH (ref 98–111)
Creatinine, Ser: 1.45 mg/dL — ABNORMAL HIGH (ref 0.61–1.24)
GFR, Estimated: 57 mL/min — ABNORMAL LOW (ref >60)
Glucose, Bld: 83 mg/dL (ref 70–99)
Potassium: 4 mmol/L (ref 3.5–5.1)
Sodium: 143 mmol/L (ref 135–145)

## 2022-10-30 LAB — MRSA NEXT GEN BY PCR, NASAL: MRSA by PCR Next Gen: NOT DETECTED

## 2022-10-30 LAB — GLUCOSE, CAPILLARY
Glucose-Capillary: 103 mg/dL — ABNORMAL HIGH (ref 70–99)
Glucose-Capillary: 54 mg/dL — ABNORMAL LOW (ref 70–99)
Glucose-Capillary: 91 mg/dL (ref 70–99)
Glucose-Capillary: 93 mg/dL (ref 70–99)
Glucose-Capillary: 134 mg/dL — ABNORMAL HIGH (ref 70–99)
Glucose-Capillary: 98 mg/dL (ref 70–99)

## 2022-10-30 LAB — HEMOGLOBIN A1C
Hgb A1c MFr Bld: 6.5 % — ABNORMAL HIGH (ref 4.8–5.6)
Mean Plasma Glucose: 139.85 mg/dL

## 2022-10-30 LAB — MAGNESIUM: Magnesium: 1.7 mg/dL (ref 1.7–2.4)

## 2022-10-30 LAB — PHOSPHORUS: Phosphorus: 3.5 mg/dL (ref 2.5–4.6)

## 2022-10-30 LAB — SURGICAL PCR SCREEN
MRSA, PCR: NEGATIVE
Staphylococcus aureus: NEGATIVE

## 2022-10-30 LAB — HIV ANTIBODY (ROUTINE TESTING W REFLEX): HIV Screen 4th Generation wRfx: NONREACTIVE

## 2022-10-30 LAB — TRIGLYCERIDES: Triglycerides: 112 mg/dL (ref <150)

## 2022-10-30 LAB — ETHANOL: Alcohol, Ethyl (B): <10 mg/dL (ref <10)

## 2022-10-30 MED ORDER — FOLIC ACID 5 MG/ML IJ SOLN
1.0000 mg | Freq: Every day | INTRAMUSCULAR | Status: DC
  Administered 2022-10-30: 1 mg via INTRAVENOUS
  Filled 2022-10-30: qty 0.2

## 2022-10-30 MED ORDER — THIAMINE HCL 100 MG/ML IJ SOLN
100.0000 mg | Freq: Every day | INTRAMUSCULAR | Status: DC
  Administered 2022-10-30: 100 mg via INTRAVENOUS
  Filled 2022-10-30: qty 2

## 2022-10-30 MED ORDER — SODIUM CHLORIDE 0.9 % IV SOLN
1.0000 g | INTRAVENOUS | Status: DC
  Administered 2022-10-30 – 2022-10-31 (×2): 1 g via INTRAVENOUS
  Filled 2022-10-30 (×4): qty 10

## 2022-10-30 MED ORDER — ARIPIPRAZOLE 2 MG PO TABS
2.0000 mg | ORAL_TABLET | Freq: Every day | ORAL | Status: DC
  Administered 2022-10-31 (×2): 2 mg via ORAL
  Filled 2022-10-30 (×3): qty 1

## 2022-10-30 MED ORDER — ATORVASTATIN CALCIUM 10 MG PO TABS
20.0000 mg | ORAL_TABLET | Freq: Every day | ORAL | Status: DC
  Administered 2022-10-30 – 2022-10-31 (×2): 20 mg via ORAL
  Filled 2022-10-30 (×2): qty 2

## 2022-10-30 MED ORDER — DOCUSATE SODIUM 100 MG PO CAPS
100.0000 mg | ORAL_CAPSULE | Freq: Two times a day (BID) | ORAL | Status: DC
  Administered 2022-10-30 – 2022-11-01 (×4): 100 mg via ORAL
  Filled 2022-10-30 (×4): qty 1

## 2022-10-30 MED ORDER — DEXTROSE 50 % IV SOLN
INTRAVENOUS | Status: AC
  Filled 2022-10-30: qty 50

## 2022-10-30 MED ORDER — POLYETHYLENE GLYCOL 3350 17 G PO PACK
17.0000 g | PACK | Freq: Every day | ORAL | Status: DC
  Administered 2022-10-31: 17 g via ORAL
  Filled 2022-10-30 (×2): qty 1

## 2022-10-30 MED ORDER — CHLORHEXIDINE GLUCONATE CLOTH 2 % EX PADS
6.0000 | MEDICATED_PAD | Freq: Every morning | CUTANEOUS | Status: DC
  Administered 2022-10-30 (×2): 6 via TOPICAL

## 2022-10-30 MED ORDER — GABAPENTIN 100 MG PO CAPS
100.0000 mg | ORAL_CAPSULE | Freq: Every day | ORAL | Status: DC
  Administered 2022-10-31: 100 mg via ORAL
  Filled 2022-10-30: qty 1

## 2022-10-30 MED ORDER — MAGNESIUM SULFATE 2 GM/50ML IV SOLN
2.0000 g | Freq: Once | INTRAVENOUS | Status: AC
  Administered 2022-10-30: 2 g via INTRAVENOUS
  Filled 2022-10-30: qty 50

## 2022-10-30 MED ORDER — ADULT MULTIVITAMIN W/MINERALS CH
1.0000 | ORAL_TABLET | Freq: Every day | ORAL | Status: DC
  Administered 2022-10-31 – 2022-11-01 (×2): 1 via ORAL
  Filled 2022-10-30 (×2): qty 1

## 2022-10-30 MED ORDER — ADULT MULTIVITAMIN W/MINERALS CH
1.0000 | ORAL_TABLET | Freq: Every day | ORAL | Status: DC
  Filled 2022-10-30: qty 1

## 2022-10-30 MED ORDER — LAMOTRIGINE 25 MG PO TABS
50.0000 mg | ORAL_TABLET | Freq: Two times a day (BID) | ORAL | Status: DC
  Administered 2022-10-30 – 2022-11-01 (×4): 50 mg via ORAL
  Filled 2022-10-30 (×4): qty 2

## 2022-10-30 MED ORDER — FOLIC ACID 1 MG PO TABS
1.0000 mg | ORAL_TABLET | Freq: Every day | ORAL | Status: DC
  Administered 2022-10-31 – 2022-11-01 (×2): 1 mg via ORAL
  Filled 2022-10-30 (×2): qty 1

## 2022-10-30 MED ORDER — CHLORHEXIDINE GLUCONATE CLOTH 2 % EX PADS
6.0000 | MEDICATED_PAD | Freq: Once | CUTANEOUS | Status: AC
  Administered 2022-10-30: 6 via TOPICAL

## 2022-10-30 MED ORDER — DEXTROSE 50 % IV SOLN
25.0000 g | INTRAVENOUS | Status: AC
  Administered 2022-10-30: 25 g via INTRAVENOUS

## 2022-10-30 MED ORDER — THIAMINE MONONITRATE 100 MG PO TABS
100.0000 mg | ORAL_TABLET | Freq: Every day | ORAL | Status: DC
  Administered 2022-10-31 – 2022-11-01 (×2): 100 mg via ORAL
  Filled 2022-10-30 (×2): qty 1

## 2022-10-30 NOTE — Progress Notes (Signed)
Pt has existing USGPIV that can be used for vasopressor infusion. Unit RN to consult IV team if additional access is needed.

## 2022-10-30 NOTE — Progress Notes (Signed)
eLink Physician-Brief Progress Note Patient Name: Joseph Mckee DOB: February 16, 1966 MRN: [id number]   Date of Service  10/30/2022  HPI/Events of Note  Patient admitted with seizures, altered mental status, acute respiratory failure requiring intubation / mechanical ventilation, r/o septic shock, and r/o meningitis, there is also the possibility that seizures were induced by withdrawal from illicit drugs, work up is in progress.  eICU Interventions  New Patient Evaluation.        Min Collymore U Yussuf Sawyers 10/30/2022, 1:27 AM
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
## 2022-10-30 NOTE — Progress Notes (Signed)
Patient's son called and updated of patient's transfer to 3W[facility]. Raghav Verrilli, [name], RN
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
## 2022-10-30 NOTE — Progress Notes (Signed)
Attempted Echocardiogram, Physical and Occupational therapy in progress.

## 2022-10-30 NOTE — Progress Notes (Signed)
vLTM discontinued  No skin breakdown noted at all skin sites  [facility] notified 

## 2022-10-30 NOTE — Progress Notes (Addendum)
Spoke with the son again.  He reports that the patient's girlfriend reports that the patient has been abusing cocaine again. LP was successfully performed in the ED-appreciate the ED team performing the LP. Preliminary CSF results are not suggestive of infection.  Meningitis/encephalitis panel is negative. Can discontinue meningitic dose antibiotics.  Will defer sepsis antibiotics to critical care team. Will need MRI brain at some point. Continue LTM EEG for now, if no seizures overnight, can discontinue in the morning. Continue Keppra for now Continue to maintain seizure precautions Check urinary toxicology screen  Plan relayed to Dr. Judeth Horn  -- Milon Dikes, MD Neurologist [facility] Pager: [phone] Additional 15 min of CC time
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
## 2022-10-30 NOTE — Procedures (Signed)
Extubation Procedure Note  Patient Details:   Name: Joseph Mckee DOB: [birth year] MRN: [id number]   Airway Documentation:    Vent end date: 10/30/22 Vent end time: 1036   Evaluation  O2 sats: stable throughout and currently acceptable Complications: No apparent complications Patient did tolerate procedure well. Bilateral Breath Sounds: Clear, Diminished   Yes  Positive cuff leak noted prior to extubation, Pt placed on 3L [place]. Pt encouraged to use [place] to clear secretions post extubation. No stridor noted, no increased WOB, no distress.   Carolan Shiver 10/30/2022, 10:38 AM

## 2022-10-30 NOTE — Progress Notes (Signed)
Hypoglycemic Event  CBG: 54  Treatment: D50 25 mL (12.5 gm)  Symptoms: None  Follow-up CBG: [id number] CBG Result:91  Possible Reasons for Event: Inadequate meal intake  Comments/MD notified:Payne, NP at bedside.     Joseph Mckee, [name]

## 2022-10-30 NOTE — Progress Notes (Signed)
LTM EEG hooked up and running - no initial skin breakdown - push button tested - [facility] monitoring.  
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
## 2022-10-30 NOTE — Plan of Care (Signed)
Preliminary review of EEG negative for seizures.  Neurology will continue to follow.  -- Milon Dikes, MD Neurologist [facility] Pager: [phone]

## 2022-10-30 NOTE — Progress Notes (Signed)
Echocardiogram 2D Echocardiogram has been performed.  Joseph Mckee 10/30/2022, 3:44 PM

## 2022-10-30 NOTE — Progress Notes (Signed)
eLink Physician-Brief Progress Note Patient Name: Joseph Mckee DOB: Apr 26, 1966 MRN: [id number]   Date of Service  10/30/2022  HPI/Events of Note  Patient needs restraints to prevent self-extubation.  eICU Interventions  Restraints ordered.        Thomasene Lot Hildagard Sobecki 10/30/2022, 3:39 AM

## 2022-10-30 NOTE — Plan of Care (Signed)

## 2022-10-31 DIAGNOSIS — R569 Unspecified convulsions: Secondary | ICD-10-CM | POA: Diagnosis not present

## 2022-10-31 DIAGNOSIS — E785 Hyperlipidemia, unspecified: Secondary | ICD-10-CM

## 2022-10-31 DIAGNOSIS — G9340 Encephalopathy, unspecified: Secondary | ICD-10-CM | POA: Diagnosis not present

## 2022-10-31 DIAGNOSIS — A419 Sepsis, unspecified organism: Secondary | ICD-10-CM

## 2022-10-31 DIAGNOSIS — R220 Localized swelling, mass and lump, head: Secondary | ICD-10-CM | POA: Insufficient documentation

## 2022-10-31 DIAGNOSIS — I152 Hypertension secondary to endocrine disorders: Secondary | ICD-10-CM

## 2022-10-31 DIAGNOSIS — E876 Hypokalemia: Secondary | ICD-10-CM

## 2022-10-31 DIAGNOSIS — E1159 Type 2 diabetes mellitus with other circulatory complications: Secondary | ICD-10-CM

## 2022-10-31 DIAGNOSIS — R6521 Severe sepsis with septic shock: Secondary | ICD-10-CM

## 2022-10-31 DIAGNOSIS — J9601 Acute respiratory failure with hypoxia: Secondary | ICD-10-CM | POA: Diagnosis not present

## 2022-10-31 DIAGNOSIS — E870 Hyperosmolality and hypernatremia: Secondary | ICD-10-CM

## 2022-10-31 DIAGNOSIS — E1169 Type 2 diabetes mellitus with other specified complication: Secondary | ICD-10-CM

## 2022-10-31 LAB — CBC
HCT: 32.8 % — ABNORMAL LOW (ref 39.0–52.0)
Hemoglobin: 11.1 g/dL — ABNORMAL LOW (ref 13.0–17.0)
MCH: 32.1 pg (ref 26.0–34.0)
MCHC: 33.8 g/dL (ref 30.0–36.0)
MCV: 94.8 fL (ref 80.0–100.0)
Platelets: 125 10*3/uL — ABNORMAL LOW (ref 150–400)
RBC: 3.46 MIL/uL — ABNORMAL LOW (ref 4.22–5.81)
RDW: 17.9 % — ABNORMAL HIGH (ref 11.5–15.5)
WBC: 8.4 10*3/uL (ref 4.0–10.5)
nRBC: 1 % — ABNORMAL HIGH (ref 0.0–0.2)

## 2022-10-31 LAB — BASIC METABOLIC PANEL
Anion gap: 7 (ref 5–15)
BUN: 16 mg/dL (ref 6–20)
CO2: 25 mmol/L (ref 22–32)
Calcium: 8.4 mg/dL — ABNORMAL LOW (ref 8.9–10.3)
Chloride: 109 mmol/L (ref 98–111)
Creatinine, Ser: 1.29 mg/dL — ABNORMAL HIGH (ref 0.61–1.24)
GFR, Estimated: >60 mL/min (ref >60)
Glucose, Bld: 99 mg/dL (ref 70–99)
Potassium: 3.3 mmol/L — ABNORMAL LOW (ref 3.5–5.1)
Sodium: 141 mmol/L (ref 135–145)

## 2022-10-31 LAB — GLUCOSE, CAPILLARY
Glucose-Capillary: 101 mg/dL — ABNORMAL HIGH (ref 70–99)
Glucose-Capillary: 104 mg/dL — ABNORMAL HIGH (ref 70–99)
Glucose-Capillary: 117 mg/dL — ABNORMAL HIGH (ref 70–99)
Glucose-Capillary: 76 mg/dL (ref 70–99)
Glucose-Capillary: 87 mg/dL (ref 70–99)

## 2022-10-31 LAB — MAGNESIUM: Magnesium: 2 mg/dL (ref 1.7–2.4)

## 2022-10-31 MED ORDER — NAPHAZOLINE-PHENIRAMINE 0.025-0.3 % OP SOLN
1.0000 [drp] | Freq: Four times a day (QID) | OPHTHALMIC | Status: DC | PRN
  Administered 2022-10-31 – 2022-11-01 (×2): 1 [drp] via OPHTHALMIC
  Filled 2022-10-31: qty 15

## 2022-10-31 MED ORDER — POTASSIUM CHLORIDE CRYS ER 20 MEQ PO TBCR
40.0000 meq | EXTENDED_RELEASE_TABLET | Freq: Once | ORAL | Status: AC
  Administered 2022-10-31: 40 meq via ORAL
  Filled 2022-10-31: qty 2
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
## 2022-10-31 NOTE — Assessment & Plan Note (Signed)
-   Consult Neurology - Continue Lamictal

## 2022-10-31 NOTE — Assessment & Plan Note (Signed)
Continue Abilify °

## 2022-10-31 NOTE — Assessment & Plan Note (Signed)
Resolved with fluids °
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
## 2022-10-31 NOTE — Plan of Care (Signed)

## 2022-10-31 NOTE — Assessment & Plan Note (Signed)
Continue atorvastatin

## 2022-10-31 NOTE — Assessment & Plan Note (Signed)
Presented with fever, tachycardia/tachypnea, and lower lobe opacities on chest imaging, atelectasis vs infiltrates.  Required intubation and pressors on admission.  Now improving. - Continue Rocephin, day 3 of 5
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
## 2022-10-31 NOTE — Assessment & Plan Note (Signed)
Glucoses improved - Continue SS corrections

## 2022-10-31 NOTE — Assessment & Plan Note (Signed)
Confused and obtunded on admission.  Likely post-ictal, as well as related to hypernatremia/AKI, and possibly cocaine.  Now resolved.

## 2022-10-31 NOTE — Assessment & Plan Note (Signed)
BP low on admission, now improving - Hold home Benicar/HCTZ

## 2022-10-31 NOTE — Assessment & Plan Note (Signed)
TOC consult ?

## 2022-10-31 NOTE — Assessment & Plan Note (Addendum)
Presented with apnea, requiring intubation.  Presumed aspiration in setting of possible seizure, encephalopathy.
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
## 2022-10-31 NOTE — TOC Initial Note (Signed)
Transition of Care [facility]) - Initial/Assessment Note    Patient Details  Name: Joseph Mckee MRN: [id number] Date of Birth: 1966/04/29  Transition of Care [facility]) CM/SW Contact:    Kermit Balo, RN Phone Number: 10/31/2022, 2:40 PM  Clinical Narrative:                 Pt is from home with his fiance. They are together most of the time.  No DME at home.  Pt manages his own medications and denies any issues.  Pharmacy: [facility] on [address].  Outpt arranged at [facility]. Information on the AVS for pt to call and schedule an appointment. CM has sent the referral to the outpt rehab. ToC following.  Expected Discharge Plan: OP Rehab Barriers to Discharge: Continued Medical Work up   Patient Goals and CMS Choice     Choice offered to / list presented to : Patient      Expected Discharge Plan and Services   Discharge Planning Services: CM Consult   Living arrangements for the past 2 months: Single Family Home                                      Prior Living Arrangements/Services Living arrangements for the past 2 months: Single Family Home Lives with:: Significant Other Patient language and need for interpreter reviewed:: Yes Do you feel safe going back to the place where you live?: Yes            Criminal Activity/Legal Involvement Pertinent to Current Situation/Hospitalization: No - Comment as needed  Activities of Daily Living Home Assistive Devices/Equipment: None ADL Screening (condition at time of admission) Patient's cognitive ability adequate to safely complete daily activities?: Yes Is the patient deaf or have difficulty hearing?: No Does the patient have difficulty seeing, even when wearing glasses/contacts?: No Does the patient have difficulty concentrating, remembering, or making decisions?: No Patient able to express need for assistance with ADLs?: Yes Does the patient have difficulty dressing or bathing?:  No Independently performs ADLs?: Yes (appropriate for developmental age) Does the patient have difficulty walking or climbing stairs?: No Weakness of Legs: None Weakness of Arms/Hands: None  Permission Sought/Granted                  Emotional Assessment Appearance:: Appears stated age Attitude/Demeanor/Rapport: Engaged Affect (typically observed): Accepting Orientation: : Oriented to Self, Oriented to Place, Oriented to  Time, Oriented to Situation   Psych Involvement: No (comment)  Admission diagnosis:  Acute hypoxemic respiratory failure [J96.01] Patient Active Problem List   Diagnosis Date Noted   Septic shock 10/31/2022   Hypernatremia 10/31/2022   Morbid obesity 10/31/2022   Hypokalemia 10/31/2022   Seizure-like activity 10/30/2022   AKI (acute kidney injury) 10/30/2022   Polysubstance abuse 10/30/2022   Acute toxic and metabolic encephalopathy, 10/30/2022   Acute respiratory failure with hypoxia 10/29/2022   Nicotine dependence 02/01/2022   Type 2 diabetes mellitus with diabetic polyneuropathy, with long-term current use of insulin 08/03/2021   Hyperlipidemia associated with type 2 diabetes mellitus    Hypertension associated with diabetes    Depression    Posttraumatic headache 09/09/2018   Cervical radiculopathy at C8 09/09/2018   PCP:  Hoy Register, MD Pharmacy:   [facility] - [place], [place] - [address] [place] [address] Phone: [phone] Fax: [phone]  Social Determinants of Health (SDOH) Social History: SDOH Screenings   Food Insecurity: No Food Insecurity (10/30/2022)  Housing: Low Risk  (10/30/2022)  Transportation Needs: No Transportation Needs (10/30/2022)  Utilities: Not At Risk (10/30/2022)  Depression (PHQ2-9): Low Risk  (09/24/2022)  Tobacco Use: Medium Risk (10/30/2022)   SDOH Interventions:     Readmission Risk Interventions     No data to display

## 2022-10-31 NOTE — Assessment & Plan Note (Signed)
Likely due to trauma.  Patient notes this was present yesterday when he was extubated, did not worsen overnight with Rocephin.  I discussed with PCCM team, they think this may have been present since extubation. - Monitor

## 2022-10-31 NOTE — Assessment & Plan Note (Signed)
BMI 36 with comorbid hypertension and diabetes

## 2022-10-31 NOTE — Assessment & Plan Note (Signed)
Cr 2.8 on admission, improved with fluids to 1.2

## 2022-10-31 NOTE — Assessment & Plan Note (Signed)
Resolved with supplementation and starting spironolactone. 

## 2022-11-01 DIAGNOSIS — J9601 Acute respiratory failure with hypoxia: Secondary | ICD-10-CM | POA: Diagnosis not present

## 2022-11-01 DIAGNOSIS — R220 Localized swelling, mass and lump, head: Secondary | ICD-10-CM

## 2022-11-01 DIAGNOSIS — A419 Sepsis, unspecified organism: Secondary | ICD-10-CM | POA: Diagnosis not present

## 2022-11-01 DIAGNOSIS — F320 Major depressive disorder, single episode, mild: Secondary | ICD-10-CM

## 2022-11-01 DIAGNOSIS — R569 Unspecified convulsions: Secondary | ICD-10-CM | POA: Diagnosis not present

## 2022-11-01 DIAGNOSIS — G9340 Encephalopathy, unspecified: Secondary | ICD-10-CM | POA: Diagnosis not present

## 2022-11-01 DIAGNOSIS — F1910 Other psychoactive substance abuse, uncomplicated: Secondary | ICD-10-CM

## 2022-11-01 LAB — COMPREHENSIVE METABOLIC PANEL
ALT: 30 U/L (ref 0–44)
AST: 45 U/L — ABNORMAL HIGH (ref 15–41)
Albumin: 2.7 g/dL — ABNORMAL LOW (ref 3.5–5.0)
Alkaline Phosphatase: 61 U/L (ref 38–126)
Anion gap: 7 (ref 5–15)
BUN: 13 mg/dL (ref 6–20)
CO2: 23 mmol/L (ref 22–32)
Calcium: 8.2 mg/dL — ABNORMAL LOW (ref 8.9–10.3)
Chloride: 108 mmol/L (ref 98–111)
Creatinine, Ser: 1.23 mg/dL (ref 0.61–1.24)
GFR, Estimated: >60 mL/min (ref >60)
Glucose, Bld: 96 mg/dL (ref 70–99)
Potassium: 3.5 mmol/L (ref 3.5–5.1)
Sodium: 138 mmol/L (ref 135–145)
Total Bilirubin: 0.8 mg/dL (ref 0.3–1.2)
Total Protein: 5.7 g/dL — ABNORMAL LOW (ref 6.5–8.1)

## 2022-11-01 LAB — CBC
HCT: 32 % — ABNORMAL LOW (ref 39.0–52.0)
Hemoglobin: 10.9 g/dL — ABNORMAL LOW (ref 13.0–17.0)
MCH: 32.3 pg (ref 26.0–34.0)
MCHC: 34.1 g/dL (ref 30.0–36.0)
MCV: 95 fL (ref 80.0–100.0)
Platelets: 130 10*3/uL — ABNORMAL LOW (ref 150–400)
RBC: 3.37 MIL/uL — ABNORMAL LOW (ref 4.22–5.81)
RDW: 16.7 % — ABNORMAL HIGH (ref 11.5–15.5)
WBC: 7.7 10*3/uL (ref 4.0–10.5)
nRBC: 0.4 % — ABNORMAL HIGH (ref 0.0–0.2)

## 2022-11-01 LAB — GLUCOSE, CAPILLARY
Glucose-Capillary: 112 mg/dL — ABNORMAL HIGH (ref 70–99)
Glucose-Capillary: 85 mg/dL (ref 70–99)
Glucose-Capillary: 99 mg/dL (ref 70–99)

## 2022-11-01 LAB — CULTURE, BLOOD (ROUTINE X 2): Culture: NO GROWTH

## 2022-11-01 LAB — CSF CULTURE W GRAM STAIN

## 2022-11-01 MED ORDER — AMOXICILLIN-POT CLAVULANATE 875-125 MG PO TABS: 1.0000 | ORAL_TABLET | Freq: Two times a day (BID) | ORAL | 0 refills | Status: DC

## 2022-11-01 MED ORDER — DIVALPROEX SODIUM ER 500 MG PO TB24: 500.0000 mg | ORAL_TABLET | Freq: Every morning | ORAL | 3 refills | Status: DC

## 2022-11-01 MED ORDER — LAMOTRIGINE 100 MG PO TABS: 50.0000 mg | ORAL_TABLET | Freq: Two times a day (BID) | ORAL | 3 refills | Status: DC

## 2022-11-01 NOTE — TOC CAGE-AID Note (Signed)
Transition of Care [facility]) - CAGE-AID Screening   Patient Details  Name: Jonathon Tan MRN: [id number] Date of Birth: Jul 10, 1966  Transition of Care [facility]) CM/SW Contact:    Kermit Balo, RN Phone Number: 11/01/2022, 11:50 AM   Clinical Narrative: CM provided pt with inpatient/ outpatient drug counseling resources   CAGE-AID Screening:    Have You Ever Felt You Ought to Cut Down on Your Drinking or Drug Use?: Yes Have People Annoyed You By Critizing Your Drinking Or Drug Use?: No Have You Felt Bad Or Guilty About Your Drinking Or Drug Use?: No Have You Ever Had a Drink or Used Drugs First Thing In The Morning to Steady Your Nerves or to Get Rid of a Hangover?: No CAGE-AID Score: 1  Substance Abuse Education Offered: Yes  Substance abuse interventions: Patient Counseling

## 2022-11-01 NOTE — Plan of Care (Signed)

## 2022-11-01 NOTE — TOC Transition Note (Signed)
Transition of Care [facility]) - CM/SW Discharge Note   Patient Details  Name: Joseph Mckee MRN: [id number] Date of Birth: 14-Aug-1965  Transition [facility]) CM/SW Contact:  Kermit Balo, RN Phone Number: 11/01/2022, 11:56 AM   Clinical Narrative:    Pt is discharging home with outpatient therapy. Information on the AVS.  Pt has transportation home.    Final next level of care: OP Rehab Barriers to Discharge: No Barriers Identified   Patient Goals and CMS Choice   Choice offered to / list presented to : Patient  Discharge Placement                         Discharge Plan and Services Additional resources added to the After Visit Summary for     Discharge Planning Services: CM Consult                                 Social Determinants of Health (SDOH) Interventions SDOH Screenings   Food Insecurity: No Food Insecurity (10/30/2022)  Housing: Low Risk  (10/30/2022)  Transportation Needs: No Transportation Needs (10/30/2022)  Utilities: Not At Risk (10/30/2022)  Depression (PHQ2-9): Low Risk  (09/24/2022)  Tobacco Use: Medium Risk (10/30/2022)     Readmission Risk Interventions     No data to display

## 2022-11-01 NOTE — Care Plan (Signed)
Called pharmacy and canceled Depakote refill, as patient related that he does not and did not take this medication prior to admission.

## 2022-11-01 NOTE — Discharge Summary (Signed)
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 terminated or revoked.     Resp Syncytial Virus by PCR NEGATIVE NEGATIVE Final    Comment: (NOTE) Fact Sheet for Patients: BloggerCourse.com  Fact Sheet for Healthcare Providers: SeriousBroker.it  This test is not yet approved or cleared by the [place] FDA and has been authorized for detection and/or diagnosis of SARS-CoV-2 by FDA under an Emergency Use Authorization (EUA). This EUA will remain in effect (meaning this test can be used) for the duration of the COVID-19 declaration under Section 564(b)(1) of the Act, 21 U.S.C. section 360bbb-3(b)(1), unless the  authorization is terminated or revoked.  Performed at [facility], [address], [place], [place] [address]   Blood Culture (routine x 2)     Status: None (Preliminary result)   Collection Time: 10/29/22   7:09 PM   Specimen: BLOOD  Result Value Ref Range Status   Specimen Description BLOOD RIGHT ANTECUBITAL  Final   Special Requests   Final    BOTTLES DRAWN AEROBIC AND ANAEROBIC Blood Culture adequate volume   Culture   Final    NO GROWTH 3 DAYS Performed at [facility], [address], [place], [place] [address]    Report Status PENDING  Incomplete  Blood Culture (routine x 2)     Status: None (Preliminary result)   Collection Time: 10/29/22  7:50 PM   Specimen: BLOOD RIGHT FOREARM  Result Value Ref Range Status   Specimen Description BLOOD RIGHT FOREARM  Final   Special Requests   Final    BOTTLES DRAWN AEROBIC AND ANAEROBIC Blood Culture adequate volume   Culture   Final    NO GROWTH 3 DAYS Performed at [facility], [address], [place], [place] [address]    Report Status PENDING  Incomplete  Urine Culture (for pregnant, neutropenic or urologic patients or patients with an indwelling urinary catheter)     Status: None   Collection Time: 10/29/22  8:31 PM   Specimen: Urine, Catheterized  Result Value Ref Range Status   Specimen Description URINE, CATHETERIZED  Final   Special Requests NONE  Final   Culture   Final    NO GROWTH Performed at [facility], [address] [place], [place] [address]    Report Status 10/30/2022 FINAL  Final  CSF culture w Gram Stain     Status: None (Preliminary result)   Collection Time: 10/29/22 10:45 PM   Specimen: CSF; Cerebrospinal Fluid  Result Value Ref Range Status   Specimen Description CSF  Final   Special Requests TUBE 2  Final   Gram Stain   Final    CYTOSPIN SMEAR WBC PRESENT,BOTH PMN AND MONONUCLEAR NO ORGANISMS SEEN    Culture   Final    NO GROWTH 3 DAYS Performed at [facility], [address] [place], [place] [address]    Report Status PENDING  Incomplete  Surgical PCR screen     Status: None   Collection Time: 10/30/22 12:49 AM   Specimen: Nasal Mucosa; Nasal Swab  Result Value Ref  Range Status   MRSA, PCR NEGATIVE NEGATIVE Final   Staphylococcus aureus NEGATIVE NEGATIVE Final    Comment: (NOTE) The Xpert SA Assay (FDA approved for NASAL specimens in patients 60 years of age and older), is one component of a comprehensive surveillance program. It is not intended to diagnose infection nor to guide or monitor treatment. Performed at [facility], [address] [place], [place] [address]   MRSA Next Gen by PCR, Nasal     Status: None   Collection Time: 10/30/22  4:36 AM   Specimen: Nasal Mucosa; Nasal Swab  Result Value Ref Range Status   MRSA by PCR Next Gen NOT DETECTED NOT DETECTED Final    Comment: (NOTE) The GeneXpert MRSA Assay (FDA approved for NASAL specimens only), is one component of a comprehensive MRSA colonization surveillance program. It is not intended to diagnose MRSA infection nor to guide or monitor treatment for MRSA infections. Test performance is not FDA approved in  patients less than 22 years old. Performed at [facility], [address], [place], [place] [address]     Labs: CBC: Recent Labs  Lab 10/29/22 1904 10/29/22 2001 10/30/22 0336 10/30/22 0556 10/31/22 0413 11/01/22 0308  WBC 7.1  --   --  9.8 8.4 7.7  NEUTROABS 4.9  --   --   --   --   --   HGB 14.6 13.3 11.2* 11.1* 11.1* 10.9*  HCT 45.4 39.0 33.0* 34.0* 32.8* 32.0*  MCV 98.3  --   --  97.1 94.8 95.0  PLT 359  --   --  146* 125* 130*   Basic Metabolic Panel: Recent Labs  Lab 10/29/22 1904 10/29/22 2001 10/30/22 0336 10/30/22 0702 10/31/22 0413 11/01/22 0308  NA 150* 148* 146* 143 141 138  K 4.5 4.2 3.4* 4.0 3.3* 3.5  CL 113*  --   --  114* 109 108  CO2 20*  --   --  19* 25 23  GLUCOSE 161*  --   --  83 99 96  BUN 22*  --   --  21* 16 13  CREATININE 2.78*  --   --  1.45* 1.29* 1.23  CALCIUM 10.5*  --   --  8.3* 8.4* 8.2*  MG  --   --   --  1.7 2.0  --   PHOS  --   --   --  3.5  --   --    Liver Function Tests: Recent Labs  Lab  10/29/22 1904 11/01/22 0308  AST 40 45*  ALT 29 30  ALKPHOS 82 61  BILITOT 1.0 0.8  PROT 8.1 5.7*  ALBUMIN 4.0 2.7*   CBG: Recent Labs  Lab 10/31/22 1614 10/31/22 2024 11/01/22 0036 11/01/22 0440 11/01/22 0849  GLUCAP 87 104* 99 85 112*    Discharge time spent: approximately 35 minutes spent on discharge counseling, evaluation of patient on day of discharge, and coordination of discharge planning with nursing, social work, pharmacy and case management  Signed: Alberteen Sam, MD [facility] 11/01/2022

## 2022-11-02 LAB — GLUCOSE, CAPILLARY: Glucose-Capillary: 94 mg/dL (ref 70–99)

## 2022-11-02 LAB — CSF CULTURE W GRAM STAIN

## 2022-11-03 LAB — CULTURE, BLOOD (ROUTINE X 2)
Special Requests: ADEQUATE
Special Requests: ADEQUATE

## 2022-11-05 ENCOUNTER — Encounter: Payer: Self-pay | Admitting: Neurology

## 2022-11-05 ENCOUNTER — Telehealth: Payer: Self-pay

## 2022-11-05 NOTE — Transitions of Care (Post Inpatient/ED Visit) (Signed)
   11/05/2022  Name: Joseph Mckee MRN: [id number] DOB: 03/14/1966  Today's TOC FU Call Status: Today's TOC FU Call Status:: Unsuccessul Call (1st Attempt) Unsuccessful Call (1st Attempt) Date: 11/05/22  Attempted to reach the patient regarding the most recent Inpatient/ED visit.  Follow Up Plan: Additional outreach attempts will be made to reach the patient to complete the Transitions of Care (Post Inpatient/ED visit) call.   Signature  Robyne Peers, RN

## 2022-11-06 ENCOUNTER — Telehealth: Payer: Self-pay

## 2022-11-06 NOTE — Telephone Encounter (Signed)
I returned call to the patient : [phone] , message left with call back requested

## 2022-11-06 NOTE — Transitions of Care (Post Inpatient/ED Visit) (Signed)
   11/06/2022  Name: Parthiv Mucci MRN: [id number] DOB: [birth year]  Today's TOC FU Call Status: Today's TOC FU Call Status:: Unsuccessful Call (2nd Attempt) Unsuccessful Call (1st Attempt) Date: 11/05/22 Unsuccessful Call (2nd Attempt) Date: 11/06/22  Attempted to reach the patient regarding the most recent Inpatient/ED visit.  Follow Up Plan: Additional outreach attempts will be made to reach the patient to complete the Transitions of Care (Post Inpatient/ED visit) call.   Signature  Robyne Peers, RN

## 2022-11-06 NOTE — Telephone Encounter (Signed)
Copied from CRM [id number]. Topic: General - Other >> Nov 06, 2022 11:32 AM Epimenio Foot F wrote: Reason for CRM: Pt is calling in returning a call from [place]. Please follow up with pt.

## 2022-11-07 ENCOUNTER — Telehealth: Payer: Self-pay

## 2022-11-07 NOTE — Transitions of Care (Post Inpatient/ED Visit) (Signed)
   11/07/2022  Name: Joseph Mckee MRN: [id number] DOB: 1965/09/10  Today's TOC FU Call Status: Today's TOC FU Call Status:: Successful TOC FU Call Competed Unsuccessful Call (1st Attempt) Date: 11/05/22 Unsuccessful Call (2nd Attempt) Date: 11/06/22 [facility] FU Call Complete Date: 11/07/22  Transition Care Management Follow-up Telephone Call Date of Discharge: 11/01/22 Discharge Facility: [facility]) Type of Discharge: Inpatient Admission Primary Inpatient Discharge Diagnosis:: acute toxic and metabolic encephalopathy How have you been since you were released from the hospital?: Better Any questions or concerns?: No  Items Reviewed: Did you receive and understand the discharge instructions provided?: Yes Medications obtained and verified?: Yes (Medications Reviewed) (He stated he has all medications as well as a working glucometer and he did not have any questions about the med regime,) Any new allergies since your discharge?: No Dietary orders reviewed?: Yes Type of Diet Ordered:: heart healthy, diabetic Do you have support at home?: Yes  Home Care and Equipment/Supplies: Were Home Health Services Ordered?: No Any new equipment or medical supplies ordered?: No  Functional Questionnaire: Do you need assistance with bathing/showering or dressing?: No Do you need assistance with meal preparation?: No Do you need assistance with eating?: No Do you have difficulty maintaining continence: No Do you need assistance with getting out of bed/getting out of a chair/moving?: No (He has a cane to use if needed) Do you have difficulty managing or taking your medications?: No  Follow up appointments reviewed: PCP Follow-up appointment confirmed?: Yes Date of PCP follow-up appointment?: 11/22/22 Follow-up Provider: Georgian Co, [place] Specialist Hospital Follow-up appointment confirmed?: Yes Date of Specialist follow-up appointment?: 12/07/22 Follow-Up Specialty Provider:: neurology Do  you need transportation to your follow-up appointment?: No Do you understand care options if your condition(s) worsen?: Yes-patient verbalized understanding    SIGNATURE  Robyne Peers, RN

## 2022-11-14 ENCOUNTER — Emergency Department (HOSPITAL_COMMUNITY)
Admission: EM | Admit: 2022-11-14 | Discharge: 2022-12-15 | Disposition: E | Payer: Medicaid Other | Attending: Emergency Medicine | Admitting: Emergency Medicine

## 2022-11-14 DIAGNOSIS — I469 Cardiac arrest, cause unspecified: Secondary | ICD-10-CM | POA: Diagnosis present

## 2022-11-14 DIAGNOSIS — R451 Restlessness and agitation: Secondary | ICD-10-CM | POA: Insufficient documentation

## 2022-11-14 LAB — I-STAT CHEM 8, ED
BUN: 23 mg/dL — ABNORMAL HIGH (ref 6–20)
Calcium, Ion: 1.1 mmol/L — ABNORMAL LOW (ref 1.15–1.40)
Chloride: 105 mmol/L (ref 98–111)
Creatinine, Ser: 1.7 mg/dL — ABNORMAL HIGH (ref 0.61–1.24)
Glucose, Bld: 334 mg/dL — ABNORMAL HIGH (ref 70–99)
HCT: 35 % — ABNORMAL LOW (ref 39.0–52.0)
Hemoglobin: 11.9 g/dL — ABNORMAL LOW (ref 13.0–17.0)
Potassium: 4.6 mmol/L (ref 3.5–5.1)
Sodium: 143 mmol/L (ref 135–145)
TCO2: 12 mmol/L — ABNORMAL LOW (ref 22–32)

## 2022-11-14 MED ORDER — AMIODARONE HCL 150 MG/3ML IV SOLN
INTRAVENOUS | Status: AC | PRN
  Administered 2022-11-14: 300 mg via INTRAVENOUS

## 2022-11-14 MED ORDER — EPINEPHRINE 1 MG/10ML IJ SOSY
PREFILLED_SYRINGE | INTRAMUSCULAR | Status: AC | PRN
  Administered 2022-11-14: 1 mg via INTRAVENOUS

## 2022-11-14 MED ORDER — EPINEPHRINE 1 MG/10ML IJ SOSY
PREFILLED_SYRINGE | INTRAMUSCULAR | Status: AC | PRN
  Administered 2022-11-14 (×2): 1 mg via INTRAVENOUS

## 2022-11-14 MED ORDER — SODIUM BICARBONATE 8.4 % IV SOLN
INTRAVENOUS | Status: AC | PRN
  Administered 2022-11-14: 50 meq via INTRAVENOUS

## 2022-11-14 MED ORDER — NOREPINEPHRINE BITARTRATE 1 MG/ML IV SOLN
INTRAVENOUS | Status: AC | PRN
  Administered 2022-11-14: 20 ug via INTRAVENOUS

## 2022-11-14 NOTE — Code Documentation (Signed)
Intubated by EMS with size 8.0 and 26 on the lip.

## 2022-11-14 NOTE — ED Notes (Signed)
Spoke to [place] from patient placement and updated her on patient status. Pt's family states they still are unsure of funeral home arrangements. Patient's family at bedside. Patient placement stated it is ok for pt to go to morgue once family leaves.

## 2022-11-14 NOTE — ED Triage Notes (Signed)
[place] [employer]. Pt was able to answer some questions with EMS. Became apneic in the EMS truck and asystole. CPR initiated by EMS at 1744. Total of 8 epi given by EMS with 2mg  Narcan and 1L IVF. Used cocaine today. Witnessed arrest by EMS.

## 2022-11-14 NOTE — Code Documentation (Signed)
Dr.Messick called time of death at 89.

## 2022-11-14 NOTE — ED Provider Notes (Addendum)
[facility] Provider Note   CSN: [id number] Arrival date & time: 12/04/2022  1811     History  Chief Complaint  Patient presents with   CPR    Joseph Mckee is a 57 y.o. male.  57 year old male with medical history as detailed below presents with EMS.  Patient was found by bystanders in the area of [place] [employer] [place].  Patient initially was agitated and lying on the ground.  He was initially conversant and able to provide some history.  Patient did report use of cocaine recently (earlier today).  As EMS was loading the patient into the ambulance the patient became apneic and then lost his pulse.  CPR was initiated immediately at 1744.  Patient was noted to be in asystole.  No improvement in rhythm or regain of pulse despite 7 doses of epinephrine administered during transport.  Patient was also given 2 mg of Narcan.  And IV fluids.  On arrival to ED the patient has had approximately 15 to 20 minutes of continuous CPR after witnessed arrest.  Patient intubated prior to arrival by EMS.  CPR in progress.  The history is provided by the patient, the EMS personnel and medical records.       Home Medications Prior to Admission medications   Medication Sig Start Date End Date Taking? Authorizing Provider  Accu-Chek Softclix Lancets lancets USE TO check blood sugar three times daily AS DIRECTED Patient not taking: Reported on 10/30/2022 05/07/22   Hoy Register, MD  amoxicillin-clavulanate (AUGMENTIN) 875-125 MG tablet Take 1 tablet by mouth 2 (two) times daily. 11/01/22   Danford, Earl Lites, MD  ARIPiprazole (ABILIFY) 2 MG tablet Take 1 tablet by mouth daily. 01/25/21   [provider]  atorvastatin (LIPITOR) 40 MG tablet Take 1 tablet (40 mg total) by mouth every morning. 09/27/22   Hoy Register, MD  Blood Glucose Monitoring Suppl (ACCU-CHEK GUIDE) w/Device KIT Check blood sugar 3 times daily. Patient not taking:  Reported on 10/30/2022 05/03/21   Hoy Register, MD  gabapentin (NEURONTIN) 100 MG capsule Take 2 capsules (200 mg total) by mouth at bedtime. 09/24/22   Hoy Register, MD  glucose blood (ACCU-CHEK GUIDE) test strip Use as instructed Patient not taking: Reported on 10/30/2022 05/03/21   Hoy Register, MD  lamoTRIgine (LAMICTAL) 100 MG tablet Take 0.5 tablets (50 mg total) by mouth 2 (two) times daily. 11/01/22   Danford, Earl Lites, MD  metFORMIN (GLUCOPHAGE) 500 MG tablet Take 1 tablet (500 mg total) by mouth 2 (two) times daily with a meal. 09/24/22   Hoy Register, MD  olmesartan-hydrochlorothiazide (BENICAR HCT) 40-25 MG tablet Take 1 tablet by mouth daily. 09/24/22   Hoy Register, MD  Semaglutide,0.25 or 0.5MG /DOS, (OZEMPIC, 0.25 OR 0.5 MG/DOSE,) 2 MG/1.5ML SOPN Inject 0.25 mg into the skin once a week. For 4 weeks, then increase to 0.5 mg once a week thereafter Patient not taking: Reported on 10/30/2022 02/01/22   Hoy Register, MD  Semaglutide,0.25 or 0.5MG /DOS, (OZEMPIC, 0.25 OR 0.5 MG/DOSE,) 2 MG/1.5ML SOPN Inject 0.5 mg into the skin once a week. Patient not taking: Reported on 10/30/2022 09/24/22   Hoy Register, MD  traZODone (DESYREL) 50 MG tablet Take 50 mg by mouth at bedtime.    [provider]      Allergies    Patient has no known allergies.    Review of Systems   Review of Systems  Unable to perform ROS: Intubated    Physical  Exam Updated Vital Signs BP (!) 170/12   Pulse (!) 103   Resp (!) 32   SpO2 (!) 66%  Physical Exam Vitals and nursing note reviewed.  Constitutional:      General: He is not in acute distress.    Appearance: He is well-developed.     Comments: Intubated, CPR in progress, pulseless, asystole on monitor with pulse check.  HENT:     Head: Normocephalic and atraumatic.  Eyes:     Conjunctiva/sclera: Conjunctivae normal.  Cardiovascular:     Heart sounds: Normal heart sounds.     Comments: Pulseless, asystole on  monitor Pulmonary:     Effort: No respiratory distress.     Comments: Intubated, ventilated easily with bagging, good air movement bilaterally Abdominal:     General: There is no distension.     Palpations: Abdomen is soft.     Tenderness: There is no abdominal tenderness.  Musculoskeletal:        General: No deformity.     Cervical back: Neck supple.  Skin:    General: Skin is warm and dry.  Neurological:     Comments: Unresponsive, in cardiac arrest, CPR in progress     ED Results / Procedures / Treatments   Labs (all labs ordered are listed, but only abnormal results are displayed) Labs Reviewed  I-STAT CHEM 8, ED - Abnormal; Notable for the following components:      Result Value   BUN 23 (*)    Creatinine, Ser 1.70 (*)    Glucose, Bld 334 (*)    Calcium, Ion 1.10 (*)    TCO2 12 (*)    Hemoglobin 11.9 (*)    HCT 35.0 (*)    All other components within normal limits    EKG None  Radiology No results found.  Procedures Procedures    Medications Ordered in ED Medications  sodium bicarbonate injection (50 mEq Intravenous Given Dec 10, 2022 1813)  EPINEPHrine (ADRENALIN) 1 MG/10ML injection (1 mg Intravenous Given Dec 10, 2022 1822)  EPINEPHrine (ADRENALIN) 1 MG/10ML injection (1 mg Intravenous Given 12/10/22 1818)  amiodarone (CORDARONE) injection (300 mg Intravenous Given 12-10-22 1820)  norepinephrine (LEVOPHED) injection (20 mcg Intravenous Given 2022-12-10 1822)    ED Course/ Medical Decision Making/ A&P                             Medical Decision Making Risk Prescription drug management.    Medical Screen Complete  This patient presented to the ED with complaint of cardiac arrest.  This complaint involves an extensive number of treatment options. The initial differential diagnosis includes, but is not limited to, cardiac arrest  This presentation is: Acute, Previously Undiagnosed, Uncertain Prognosis, Complicated, Systemic Symptoms, and Threat to Life/Bodily  Function  Patient brought in by EMS.  Initial call out was to evaluate agitated patient.  Patient found initially on ground near a local gas station.  Patient was able to provide some initial history.  He was agitated, but reported recent cocaine use.  As he was being transported he became apneic and was noted to be in cardiac arrest.  Asystole on monitor.  No improvement despite aggressive CPR with EMS.  On arrival the patient remains in cardiac arrest.  CPR continued.  Despite aggressive resuscitation attempts patient remained in asystole.  Despite approximately 45 minutes of CPR patient without improvement.  Time of death 41.  Patient would benefit from ME evaluation. Suspected acute cocaine toxicity leading to  arrest.     Additional history obtained:  Additional history obtained from EMS External records from outside sources obtained and reviewed including prior ED visits and prior Inpatient records.   Cardiac Monitoring:  The patient was maintained on a cardiac monitor.  I personally viewed and interpreted the cardiac monitor which showed an underlying rhythm of: asystole, agonal rhythm   Medicines ordered:  I ordered medication including as per ACLS protocol  Reevaluation of the patient after these medicines showed that the patient: stayed the same    Problem List / ED Course:  Cardiac Arrest   Reevaluation:  After the interventions noted above, I reevaluated the patient and found that they have: stayed the same  Disposition:  Cardiac arrest.   Medical examiner notified. DEATH CERTIFICATE to be completed by ME.  Son (CJ Russett) notified. Questions answered. Son enroute to ED.    CRITICAL CARE Performed by: Wynetta Fines   Total critical care time: 45 minutes  Critical care time was exclusive of separately billable procedures and treating other patients.  Critical care was necessary to treat or prevent imminent or life-threatening  deterioration.  Critical care was time spent personally by me on the following activities: development of treatment plan with patient and/or surrogate as well as nursing, discussions with consultants, evaluation of patient's response to treatment, examination of patient, obtaining history from patient or surrogate, ordering and performing treatments and interventions, ordering and review of laboratory studies, ordering and review of radiographic studies, pulse oximetry and re-evaluation of patient's condition.       Final Clinical Impression(s) / ED Diagnoses Final diagnoses:  Cardiac arrest [facility])    Rx / DC Orders ED Discharge Orders     None         Wynetta Fines, MD 12/14/2022 0016    Wynetta Fines, MD 12/21/22 [phone]

## 2022-11-14 NOTE — ED Notes (Signed)
Pt placement called by this RN. Waiting for son who is on the way.

## 2022-11-14 NOTE — Code Documentation (Signed)
Patient time of death occurred at 1826. 

## 2022-11-22 ENCOUNTER — Inpatient Hospital Stay: Payer: Medicaid Other | Admitting: Physician Assistant

## 2022-12-07 ENCOUNTER — Ambulatory Visit: Payer: Medicaid Other | Admitting: Neurology

## 2022-12-15 DEATH — deceased

## 2022-12-24 ENCOUNTER — Other Ambulatory Visit: Payer: Self-pay | Admitting: Family Medicine

## 2022-12-24 DIAGNOSIS — E1142 Type 2 diabetes mellitus with diabetic polyneuropathy: Secondary | ICD-10-CM

## 2023-01-22 ENCOUNTER — Other Ambulatory Visit: Payer: Self-pay | Admitting: Family Medicine

## 2023-01-22 DIAGNOSIS — E1169 Type 2 diabetes mellitus with other specified complication: Secondary | ICD-10-CM

## 2023-03-28 ENCOUNTER — Ambulatory Visit: Payer: Self-pay | Admitting: Family Medicine

## 2023-10-14 ENCOUNTER — Other Ambulatory Visit: Payer: Self-pay | Admitting: Family Medicine

## 2023-10-14 DIAGNOSIS — E1159 Type 2 diabetes mellitus with other circulatory complications: Secondary | ICD-10-CM
